# Patient Record
Sex: Female | Born: 1935 | Race: White | Hispanic: No | Smoking: Former smoker
Health system: Southern US, Community
[De-identification: ages and names within clinical notes are randomized; demographics above are authoritative.]

## PROBLEM LIST (undated history)

## (undated) DIAGNOSIS — D473 Essential (hemorrhagic) thrombocythemia: Secondary | ICD-10-CM

## (undated) DIAGNOSIS — E785 Hyperlipidemia, unspecified: Secondary | ICD-10-CM

## (undated) DIAGNOSIS — M8580 Other specified disorders of bone density and structure, unspecified site: Secondary | ICD-10-CM

## (undated) DIAGNOSIS — D75839 Thrombocytosis, unspecified: Secondary | ICD-10-CM

## (undated) DIAGNOSIS — M47812 Spondylosis without myelopathy or radiculopathy, cervical region: Secondary | ICD-10-CM

## (undated) DIAGNOSIS — I10 Essential (primary) hypertension: Secondary | ICD-10-CM

## (undated) DIAGNOSIS — J329 Chronic sinusitis, unspecified: Secondary | ICD-10-CM

## (undated) HISTORY — PX: OTHER SURGICAL HISTORY: SHX169

## (undated) HISTORY — DX: Spondylosis without myelopathy or radiculopathy, cervical region: M47.812

## (undated) HISTORY — DX: Other specified disorders of bone density and structure, unspecified site: M85.80

## (undated) HISTORY — DX: Thrombocytosis, unspecified: D75.839

## (undated) HISTORY — DX: Essential (hemorrhagic) thrombocythemia: D47.3

## (undated) HISTORY — DX: Chronic sinusitis, unspecified: J32.9

## (undated) HISTORY — DX: Hyperlipidemia, unspecified: E78.5

## (undated) HISTORY — DX: Essential (primary) hypertension: I10

---

## 1999-04-09 ENCOUNTER — Other Ambulatory Visit: Admission: RE | Admit: 1999-04-09 | Discharge: 1999-04-09 | Payer: Self-pay | Admitting: *Deleted

## 1999-07-14 ENCOUNTER — Encounter: Admission: RE | Admit: 1999-07-14 | Discharge: 1999-07-14 | Payer: Self-pay | Admitting: *Deleted

## 1999-07-14 ENCOUNTER — Encounter: Payer: Self-pay | Admitting: *Deleted

## 2000-04-14 ENCOUNTER — Other Ambulatory Visit: Admission: RE | Admit: 2000-04-14 | Discharge: 2000-04-14 | Payer: Self-pay | Admitting: *Deleted

## 2001-01-11 ENCOUNTER — Observation Stay (HOSPITAL_COMMUNITY): Admission: RE | Admit: 2001-01-11 | Discharge: 2001-01-12 | Payer: Self-pay | Admitting: General Surgery

## 2003-01-04 ENCOUNTER — Ambulatory Visit (HOSPITAL_COMMUNITY): Admission: RE | Admit: 2003-01-04 | Discharge: 2003-01-04 | Payer: Self-pay | Admitting: Gastroenterology

## 2003-01-30 ENCOUNTER — Encounter: Admission: RE | Admit: 2003-01-30 | Discharge: 2003-01-30 | Payer: Self-pay | Admitting: Internal Medicine

## 2003-01-30 ENCOUNTER — Encounter: Payer: Self-pay | Admitting: Internal Medicine

## 2009-01-13 ENCOUNTER — Encounter: Admission: RE | Admit: 2009-01-13 | Discharge: 2009-01-13 | Payer: Self-pay | Admitting: Internal Medicine

## 2009-01-28 ENCOUNTER — Encounter: Admission: RE | Admit: 2009-01-28 | Discharge: 2009-01-28 | Payer: Self-pay | Admitting: Internal Medicine

## 2009-05-29 ENCOUNTER — Ambulatory Visit (HOSPITAL_COMMUNITY): Admission: RE | Admit: 2009-05-29 | Discharge: 2009-05-29 | Payer: Self-pay | Admitting: Internal Medicine

## 2010-09-18 NOTE — Op Note (Signed)
[facility]  Patient:    Sarah Henson, Sarah Henson Visit Number: [id number] MRN: [id number]          Service Type: SUR Location: [id number] Attending Physician:  Brandy Hale Proc. Date: 01/11/01 Admit Date:  01/11/2001   CC:         Ammie Dalton, M.D.   Operative Report  PREOPERATIVE DIAGNOSIS:  Abdominal wall hernia, suspect spigelian hernia.  POSTOPERATIVE DIAGNOSIS:  Left spigelian hernia and weakness of the left inguinal canal floor.  OPERATION PERFORMED:  Exploration of left lower quadrant of abdomen, repair of left spigelian hernia, and left inguinal hernia with mesh.  SURGEON:  Angelia Mould. Derrell Lolling, M.D.  OPERATIVE INDICATIONS:  This is a 77 year old white female, who presents with a six month history of progressive painful bulge in her left lower quadrant and left groin.  She had one episode of vomiting in the past.  CT scan of the abdomen and pelvis was unremarkable.  Exam reveals a reducible bulge in the left lower quadrant.  This is actually somewhat higher and more lateral in the left groin than the inguinal hernia but appears somewhat lower than a spigelian hernia.  She is symptomatic and clearly has a hernia.  She is brought to the operating room electively for repair of her abdominal wall hernia.  OPERATIVE FINDINGS:  The patient had a small spigelian hernia.  It was just somewhat lower lying than the usual spigelian hernia but clear cut.  The floor of the inguinal canal was weak from the internal ring down toward the pubic tubercle, and although there was not an obvious large hernia in this location, I decided to repair the abdominal wall completely to include not only the spigelian hernia area but the inguinal floor as well.  DESCRIPTION OF PROCEDURE:  Following the induction of general endotracheal anesthesia, the patients abdomen was prepped and draped in a sterile fashion. An oblique incision was made just above the left  groin, in general paralleling the inguinal ligament.  Dissection was carried down through the subcutaneous tissue.  I identified the aponeurosis of the external oblique, cleaned this off, identified the external inguinal ring.  We opened the external oblique in the direction of its fibers and dissected it inferiorly down to the inguinal ligament and superiorly for a fair distance.  The external oblique aponeurosis had to be incised laterally all the way to and slightly beyond the anterosuperior iliac spine.  Once we explored all of this area, we found that she had a spigelian hernia at the lateral border of the rectus sheath.  The defect in the fascia was about 2 cm.  We cleaned this off and reduced all of the herniated fat.  We then closed the fascia with mattress sutures of 0 Vicryl.  We then explored the medial aspect of the inguinal canal all the way down to the pubic tubercle.  The round ligament was isolated, clamped, divided, excised, and the ends were ligated with 2-0 Vicryl and 2-0 silk ties.  One small sensory nerve was tied off with a silk tie.  The floor of the inguinal canal medially was very weak.  I chose to repair the abdominal wall thoroughly from medial to the pubic tubercle to well lateral to the spigelian hernia.  A 3 inch x 6 inch piece of polypropylene mesh was brought to the operative field and cut into an appropriate shape.  The mesh was sutured so as to generously overlap the pubic tubercle medially.  I used a 2-0 Prolene and did a running suture to fix the mesh to the inguinal ligament inferiorly and tied that laterally.  I then secured the mesh medially, superiorly, and laterally with interrupted mattress sutures of 0 Prolene.  I placed about 10 such sutures. This covered the inguinal floor and the spigelian hernia quiet well in all aspects.  The wound was copiously irrigated with saline.  Hemostasis was excellent and achieved with electrocautery.  The external  oblique was closed with a running suture of 2-0 Vicryl.  Scarpas fascia was closed with 3-0 Vicryl and the skin closed with skin staples and Steri-Strips.  Clean bandages were placed and the patient taken to the recovery room in stable condition. Estimated blood loss was about 50 cc.  Complications none.  Sponge, needle and instrument counts were correct. Attending Physician:  Brandy Hale DD:  01/11/01 TD:  01/11/01 Job: [phone]

## 2011-04-14 ENCOUNTER — Telehealth: Payer: Self-pay | Admitting: Oncology

## 2011-04-14 NOTE — Telephone Encounter (Signed)
S/w the pt and she is aware of her new pt appt on 04/29/2011@10 :30am

## 2011-04-15 ENCOUNTER — Telehealth: Payer: Self-pay | Admitting: Oncology

## 2011-04-15 NOTE — Telephone Encounter (Signed)
Referred by Dr. Ralene Ok Dx-Thrombocytosis

## 2011-04-25 ENCOUNTER — Encounter: Payer: Self-pay | Admitting: Oncology

## 2011-04-25 DIAGNOSIS — E785 Hyperlipidemia, unspecified: Secondary | ICD-10-CM | POA: Insufficient documentation

## 2011-04-25 DIAGNOSIS — M8580 Other specified disorders of bone density and structure, unspecified site: Secondary | ICD-10-CM | POA: Insufficient documentation

## 2011-04-25 DIAGNOSIS — I10 Essential (primary) hypertension: Secondary | ICD-10-CM | POA: Insufficient documentation

## 2011-04-25 DIAGNOSIS — D473 Essential (hemorrhagic) thrombocythemia: Secondary | ICD-10-CM | POA: Insufficient documentation

## 2011-04-29 ENCOUNTER — Telehealth: Payer: Self-pay | Admitting: Oncology

## 2011-04-29 ENCOUNTER — Ambulatory Visit: Payer: Medicare Other

## 2011-04-29 ENCOUNTER — Ambulatory Visit (HOSPITAL_BASED_OUTPATIENT_CLINIC_OR_DEPARTMENT_OTHER): Payer: Medicare Other | Admitting: Oncology

## 2011-04-29 ENCOUNTER — Encounter: Payer: Self-pay | Admitting: Oncology

## 2011-04-29 ENCOUNTER — Other Ambulatory Visit (HOSPITAL_BASED_OUTPATIENT_CLINIC_OR_DEPARTMENT_OTHER): Payer: Medicare Other | Admitting: Lab

## 2011-04-29 DIAGNOSIS — E785 Hyperlipidemia, unspecified: Secondary | ICD-10-CM

## 2011-04-29 DIAGNOSIS — I10 Essential (primary) hypertension: Secondary | ICD-10-CM

## 2011-04-29 DIAGNOSIS — M8580 Other specified disorders of bone density and structure, unspecified site: Secondary | ICD-10-CM

## 2011-04-29 DIAGNOSIS — R161 Splenomegaly, not elsewhere classified: Secondary | ICD-10-CM

## 2011-04-29 DIAGNOSIS — D473 Essential (hemorrhagic) thrombocythemia: Secondary | ICD-10-CM

## 2011-04-29 LAB — CBC WITH DIFFERENTIAL/PLATELET
Basophils Absolute: 0.1 10*3/uL (ref 0.0–0.1)
EOS%: 4.3 % (ref 0.0–7.0)
HCT: 43.5 % (ref 34.8–46.6)
HGB: 14.8 g/dL (ref 11.6–15.9)
LYMPH%: 25 % (ref 14.0–49.7)
MCH: 33.2 pg (ref 25.1–34.0)
NEUT%: 61 % (ref 38.4–76.8)
Platelets: 724 10*3/uL — ABNORMAL HIGH (ref 145–400)
lymph#: 2.4 10*3/uL (ref 0.9–3.3)

## 2011-04-29 LAB — MORPHOLOGY: PLT EST: INCREASED

## 2011-04-29 LAB — LACTATE DEHYDROGENASE: LDH: 207 U/L (ref 94–250)

## 2011-04-29 NOTE — Progress Notes (Signed)
[facility] INITIAL HEMATOLOGY CONSULTATION  Referral MD:  Dr. Ralene Ok, M.D.   Reason for Referral: Thrombocytosis.     HPI: Sarah Henson is a 77 yo Caucasian woman with HTN, HLP.  The oldest CBC provided for review dated from 01/28/2009 when WBC 7.4; hgb 13.2; Plt 470.  Ever since, it has been increasing.  The last CBC on 04/06/2011 showed WBC 8.4; Hgb 13.8; plt 690.  She was thus kindly referred for evaluation.  She is here for the first time with her son.  She reports mild fatigue.   She has mild SOB and DOE on walking for more than one block.  However, she is able to ambulate at home and grocery store without problem.  She denies fever, confusion, headache, visual changes, nausea/vomiting, chest pain, abdominal pain, early satiety, pruritus with warm shower, neuropathy, leg/calves swelling or pain.    Patient denies  confusion, drenching night sweats, palpable lymph node swelling, mucositis, odynophagia, dysphagia, nausea vomiting, jaundice, chest pain, palpitation, shortness of breath, dyspnea on exertion, productive cough, gum bleeding, epistaxis, hematemesis, hemoptysis, abdominal pain, abdominal swelling, early satiety, melena, hematochezia, hematuria, skin rash, spontaneous bleeding, joint swelling, joint pain, heat or cold intolerance, bowel bladder incontinence, back pain, focal motor weakness, paresthesia, depression, suicidal or homocidal ideation, feeling hopelessness.     Past Medical History  Diagnosis Date  . Hypertension   . Hyperlipidemia   . Osteopenia   . Herpes zoster   . Sinusitis   . Cervical spondylosis   . Thrombocytosis   :    Past Surgical History  Procedure Date  . Left inguinal hernia repair   . Bilateral catarac surgery   :   CURRENT MEDS: Current Outpatient Prescriptions  Medication Sig Dispense Refill  . amLODipine-olmesartan (AZOR) 5-40 MG per tablet Take 1 tablet by mouth daily.        Marland Kitchen aspirin 81 MG tablet Take 81 mg by  mouth daily.        . calcium-vitamin D (OSCAL WITH D) 500-200 MG-UNIT per tablet Take 1 tablet by mouth daily.        . hydrochlorothiazide (MICROZIDE) 12.5 MG capsule Take 12.5 mg by mouth daily.        . Multiple Vitamins-Minerals (CENTRUM SILVER PO) Take 1 tablet by mouth daily.        . nebivolol (BYSTOLIC) 5 MG tablet Take 5 mg by mouth daily.        . simvastatin (ZOCOR) 40 MG tablet Take 40 mg by mouth at bedtime.            No Known Allergies:  Family History  Problem Relation Age of Onset  . Cancer Sister     Lung cancer  . Stroke Sister   :  History   Social History  . Marital Status: Divorced    Spouse Name: N/A    Number of Children: 2  . Years of Education: N/A   Occupational History  . retired Film/video editor    Social History Main Topics  . Smoking status: Former Smoker -- 0.5 packs/day for 15 years    Quit date: 05/03/1988  . Smokeless tobacco: Never Used  . Alcohol Use: No  . Drug Use: No  . Sexually Active: No   Other Topics Concern  . Not on file   Social History Narrative  . No narrative on file   REVIEW OF SYSTEM:  The rest of the 14-point review of sytem was negative.   Exam:  ECOG 0-1  General:  Mildly obese woman in no acute distress.  Eyes:  no scleral icterus.  ENT:  There were no oropharyngeal lesions.  Neck was without thyromegaly.  Lymphatics:  Negative cervical, supraclavicular or axillary adenopathy.  Respiratory: lungs were clear bilaterally without wheezing or crackles.  Cardiovascular:  Regular rate and rhythm, S1/S2, without murmur, rub or gallop.  There was no pedal edema.  GI:  abdomen was soft, flat, nontender, nondistended.  The splenic edge was about 5 cm below costal margin with deep inspiration.   Muscoloskeletal:  no spinal tenderness of palpation of vertebral spine.  Skin exam was without echymosis, petichae.  Neuro exam was nonfocal.  Patient was able to get on and off exam table without assistance.  Gait was normal.  Patient  was alerted and oriented.  Attention was good.   Language was appropriate.  Mood was normal without depression.  Speech was not pressured.  Thought content was not tangential.    LABS:  Lab Results  Component Value Date   WBC 9.5 04/29/2011   HGB 14.8 04/29/2011   HCT 43.5 04/29/2011   PLT 724* 04/29/2011    No results found.  Blood smear review:   I personally reviewed the patient's peripheral blood smear today.  There was isocytosis.  There was no peripheral blast.  However, there was slight increase in left shift with meta's.   There was no schistocytosis, spherocytosis, target cell, rouleaux formation, tear drop cell.  There was no giant platelets or platelet clumps.    ASSESSMENT AND PLAN:   1.  Hypertension:  She is on nebivolol, amlodipine/olmesartan, and HCTZ per PCP. 2.  Hyperlipidemia:  She is on Zocor per PCP. 3.  Progressive worsened thrombocytosis with slight hypersplenomegaly on exam:  Ddx:  Myeloproliferative disease such as CML vs. ET. Vs. Myelofibrosis vs. Reactive to a lymphoproliferative process.  There is no evidence of chronic infection or inflammation or iron deficiency anemia.  Work up:  Abdominal US to rule out splenomegaly.  I sent for JAK-2 mutation and BCR-ABL to rule out ET and CML respectively.  I strongly recommended diagnostic bone marrow biopsy to further characterize her disease process.  I explained in detail the pros and cons of diagnostic bone marrow biopsy to patient and her son.  They expressed informed understanding and wished to proceed.  She is scheduled for bone marrow biopsy on 05/07/2011.    Follow up:  I'll see her in late January 2013 in order to have result of cytogenetics and will go over treatment option for the appropriate diagnosis at that time.  If there is concerning diagnosis from morphology of BM bx, I'll see her sooner.    The length of time of the face-to-face encounter was 45  minutes. More than 50% of time was spent counseling and  coordination of care.

## 2011-04-29 NOTE — Telephone Encounter (Signed)
called pt with u/s appt,aware to reg in radiology after bx and to be npo 6hrs   aom

## 2011-04-29 NOTE — Telephone Encounter (Signed)
appts made for 05/2011 for lab and rv and bone mar. rx.printed for pt   aom

## 2011-05-05 LAB — BCR/ABL (LIO MMD)

## 2011-05-07 ENCOUNTER — Ambulatory Visit: Payer: Medicare Other | Admitting: *Deleted

## 2011-05-07 ENCOUNTER — Other Ambulatory Visit: Payer: Self-pay | Admitting: Oncology

## 2011-05-07 ENCOUNTER — Ambulatory Visit (HOSPITAL_BASED_OUTPATIENT_CLINIC_OR_DEPARTMENT_OTHER): Payer: Medicare Other | Admitting: Oncology

## 2011-05-07 ENCOUNTER — Ambulatory Visit (HOSPITAL_COMMUNITY)
Admission: RE | Admit: 2011-05-07 | Discharge: 2011-05-07 | Disposition: A | Discharge status: Home / Self Care | Payer: Medicare Other | Source: Ambulatory Visit | Attending: Oncology | Admitting: Oncology

## 2011-05-07 ENCOUNTER — Other Ambulatory Visit: Payer: Medicare Other

## 2011-05-07 ENCOUNTER — Other Ambulatory Visit (HOSPITAL_COMMUNITY)
Admission: RE | Admit: 2011-05-07 | Discharge: 2011-05-07 | Disposition: A | Discharge status: Home / Self Care | Payer: Medicare Other | Source: Ambulatory Visit | Attending: Oncology | Admitting: Oncology

## 2011-05-07 DIAGNOSIS — E785 Hyperlipidemia, unspecified: Secondary | ICD-10-CM

## 2011-05-07 DIAGNOSIS — D473 Essential (hemorrhagic) thrombocythemia: Secondary | ICD-10-CM

## 2011-05-07 DIAGNOSIS — M8580 Other specified disorders of bone density and structure, unspecified site: Secondary | ICD-10-CM

## 2011-05-07 DIAGNOSIS — R161 Splenomegaly, not elsewhere classified: Secondary | ICD-10-CM

## 2011-05-07 DIAGNOSIS — I10 Essential (primary) hypertension: Secondary | ICD-10-CM

## 2011-05-07 NOTE — Patient Instructions (Signed)
Leave dressing on until tomorrow morning.  Vital signsstable and patient states understanding

## 2011-05-07 NOTE — Progress Notes (Signed)
Bone marrow biopsy and aspiration.  INDICATION:  Procedure: After obtained from consent, Sarah Henson was placed in the prone position. Time out was performed verifying correct patient and procedure. The skin overlying the left posterior crest was prepped with Betadine and draped in the usual sterile fashion. The skin and periosteum were infiltrated with 25 mL of 2% lidocaine as she was still uncomfortable with the first 10mL. A small puncture wound was made with #11 scalpel blade.  Bone marrow aspirate was obtained on the first pass of the aspiration needle.  Two separate core biopsies were obtained through the same incision as the first one was subcm.   The aspirate was sent for routine histology, flow cytometry, and cytogenetics.  Core biopsy was sent for routine histology.   Ryhanna Dunsmore Leeds tolerated procedure well with minimal  blood loss and without immediate complication.   A sterile dressing was applied.   Jethro Bolus M.D. 05/07/2011

## 2011-05-12 ENCOUNTER — Encounter: Payer: Self-pay | Admitting: Oncology

## 2011-05-28 ENCOUNTER — Ambulatory Visit (HOSPITAL_BASED_OUTPATIENT_CLINIC_OR_DEPARTMENT_OTHER): Payer: Medicare Other | Admitting: Oncology

## 2011-05-28 ENCOUNTER — Telehealth: Payer: Self-pay | Admitting: Oncology

## 2011-05-28 ENCOUNTER — Other Ambulatory Visit (HOSPITAL_BASED_OUTPATIENT_CLINIC_OR_DEPARTMENT_OTHER): Payer: Medicare Other

## 2011-05-28 VITALS — BP 141/80 | HR 68 | Temp 97.2°F | Ht 60.0 in | Wt 164.8 lb

## 2011-05-28 DIAGNOSIS — D473 Essential (hemorrhagic) thrombocythemia: Secondary | ICD-10-CM

## 2011-05-28 DIAGNOSIS — E785 Hyperlipidemia, unspecified: Secondary | ICD-10-CM

## 2011-05-28 DIAGNOSIS — I10 Essential (primary) hypertension: Secondary | ICD-10-CM

## 2011-05-28 LAB — CBC WITH DIFFERENTIAL/PLATELET
Basophils Absolute: 0.1 10*3/uL (ref 0.0–0.1)
Eosinophils Absolute: 0.5 10*3/uL (ref 0.0–0.5)
HGB: 14.3 g/dL (ref 11.6–15.9)
MCV: 95.9 fL (ref 79.5–101.0)
MONO#: 1.2 10*3/uL — ABNORMAL HIGH (ref 0.1–0.9)
MONO%: 11.6 % (ref 0.0–14.0)
NEUT#: 5.3 10*3/uL (ref 1.5–6.5)
RDW: 14 % (ref 11.2–14.5)

## 2011-05-28 MED ORDER — HYDROXYUREA 500 MG PO CAPS: 500.0000 mg | ORAL_CAPSULE | Freq: Two times a day (BID) | ORAL | Status: AC

## 2011-05-28 NOTE — Telephone Encounter (Signed)
appts made for 3weeks,7weeks and 42mo,appts printed for pt    aom

## 2011-05-28 NOTE — Progress Notes (Signed)
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   CURRENT THERAPY:  Here to discuss treatment option today.   INTERVAL HISTORY: Sarah Henson 78 y.o. female returns to clinic with her son and daughter to discuss result of work up.  She reports feeling well.  She denies Sarah Henson, visual changes, SOB, CP, palpitation, bleeding symptoms, leg/calf pain/swelling, skin rash.    MEDICAL HISTORY: Past Medical History  Diagnosis Date  . Hypertension   . Hyperlipidemia   . Osteopenia   . Herpes zoster   . Sinusitis   . Cervical spondylosis   . Thrombocytosis     SURGICAL HISTORY:  Past Surgical History  Procedure Date  . Left inguinal hernia repair   . Bilateral catarac surgery     MEDICATIONS: Current Outpatient Prescriptions  Medication Sig Dispense Refill  . amLODipine-olmesartan (AZOR) 5-40 MG per tablet Take 1 tablet by mouth daily.        Marland Kitchen aspirin 81 MG tablet Take 81 mg by mouth daily.        . calcium-vitamin D (OSCAL WITH D) 500-200 MG-UNIT per tablet Take 1 tablet by mouth daily.        . hydrochlorothiazide (MICROZIDE) 12.5 MG capsule Take 12.5 mg by mouth daily.        . Multiple Vitamins-Minerals (CENTRUM SILVER Henson) Take 1 tablet by mouth daily.        . nebivolol (BYSTOLIC) 5 MG tablet Take 5 mg by mouth daily.        . simvastatin (ZOCOR) 40 MG tablet Take 40 mg by mouth at bedtime.        . hydroxyurea (HYDREA) 500 MG capsule Take 1 capsule (500 mg total) by mouth 2 (two) times daily. May take with food to minimize GI side effects.  60 capsule  3    ALLERGIES:   has no known allergies.  REVIEW OF SYSTEMS:  The rest of the 14-point review of system was negative.   Filed Vitals:   05/28/11 0933  BP: 141/80  Pulse: 68  Temp: 97.2 F (36.2 C)   Wt Readings from Last 3 Encounters:  05/28/11 164 lb 12.8 oz (74.753 kg)  04/29/11 165 lb 8 oz (75.07 kg)   ECOG  Performance status: 0-1  PHYSICAL EXAMINATION:   General:  well-nourished in no acute distress.  Eyes:  no scleral icterus.  ENT:  There were no oropharyngeal lesions.  Neck was without thyromegaly.  Lymphatics:  Negative cervical, supraclavicular or axillary adenopathy.  Respiratory: lungs were clear bilaterally without wheezing or crackles.  Cardiovascular:  Regular rate and rhythm, S1/S2, without murmur, rub or gallop.  There was no pedal edema.  GI:  abdomen was soft, flat, nontender, nondistended, without organomegaly.  Muscoloskeletal:  no spinal tenderness of palpation of vertebral spine.  Skin exam was without echymosis, petichae.  Neuro exam was nonfocal.  Patient was able to get on and off exam table without assistance.  Gait was normal.  Patient was alerted and oriented.  Attention was good.   Language was appropriate.  Mood was normal without depression.  Speech was not pressured.  Thought content was not tangential.     LABORATORY/RADIOLOGY DATA:  Lab Results  Component Value Date   WBC 10.5* 05/28/2011   HGB 14.3 05/28/2011   HCT 42.4 05/28/2011   PLT 711* 05/28/2011    US Abdomen Limited  05/07/2011  *RADIOLOGY REPORT*  Clinical Data:  Thrombocytosis.  LIMITED ABDOMINAL ULTRASOUND  Comparison:  CT scan dated 08/27/2005  Findings: The spleen is not enlarged, measuring 5.6 cm in length with a volume of 69.3 cc.  IMPRESSION: Normal spleen.  No splenomegaly.                   Original Report Authenticated By: Gwynn Burly, M.D.   ASSESSMENT AND PLAN:   1. Hypertension: She is on nebivolol, amlodipine/olmesartan, and HCTZ per PCP.  2. Hyperlipidemia: She is on Zocor per PCP.  3. JAK-2 mutation Essential Thrombocytosis: - Diagnosis:  I discussed with patient and her family members that even though her JAK-2 mutation was negative; however, this test is only 50% sensitive for ET.  Due to progressive nature of her thrombocytosis, I still have very high clinical suspicion for ET.  BM bx  showed hypercellular but no dysplasia; cytogenetics were negative.  BCR/ABL was negative ruling out CML.  She has no obvious reason for reactive thrombocytosis.   - Prognosis:  ET in patients older than 65 carries risk of thrombotic complication.  It also has rare risk of AML and myelofibrosis transformation.   - Treatment:  I recommended Hydrea 500mg  Henson BID along with ASA 81mg  Henson daily to decrease the risk of thrombotic complication.  Hydrea does not alter the risk of AML or myelofibrosis transformation.  I discussed with patient and family members side effects of Hydrea which include but not limited to cytopenia, fatigue, bleeding, infection, skin rash, nausea/vomiting, diarrhea.  [name] expressed informed understanding and wished to proceed with Hydrea.   4.  Follow up:  Lab in 3, 7 weeks.  I'll see her in about 3 months.   The length of time of the face-to-face encounter was 25 minutes. More than 50% of time was spent counseling and coordination of care.

## 2011-06-03 ENCOUNTER — Encounter: Payer: Self-pay | Admitting: Oncology

## 2011-06-18 ENCOUNTER — Ambulatory Visit: Payer: Medicare Other | Admitting: Lab

## 2011-06-18 DIAGNOSIS — D473 Essential (hemorrhagic) thrombocythemia: Secondary | ICD-10-CM

## 2011-06-18 LAB — CBC WITH DIFFERENTIAL/PLATELET
Basophils Absolute: 0 10*3/uL (ref 0.0–0.1)
Eosinophils Absolute: 0.2 10*3/uL (ref 0.0–0.5)
HCT: 41.4 % (ref 34.8–46.6)
HGB: 13.9 g/dL (ref 11.6–15.9)
LYMPH%: 34.5 % (ref 14.0–49.7)
MCV: 98.9 fL (ref 79.5–101.0)
MONO%: 9.7 % (ref 0.0–14.0)
NEUT#: 2.9 10*3/uL (ref 1.5–6.5)
NEUT%: 51.5 % (ref 38.4–76.8)
Platelets: 431 10*3/uL — ABNORMAL HIGH (ref 145–400)
RBC: 4.19 10*6/uL (ref 3.70–5.45)

## 2011-06-21 NOTE — Progress Notes (Signed)
Called patient to inform her that her platelet count is improving and that she should continue her current dose of Hydrea (500mg  BID), per Dr. Gaylyn Rong; patient pleased at counts and verbalized understanding.

## 2011-07-10 IMAGING — CR DG CHEST 2V
2 series · 2 of 2 positions shown · non-contrast
Comparison: Chest x-ray of 01/13/2009

CLINICAL DATA: Pneumonia, follow-up

CHEST - 2 VIEW

[view not recorded (1 of 2)]
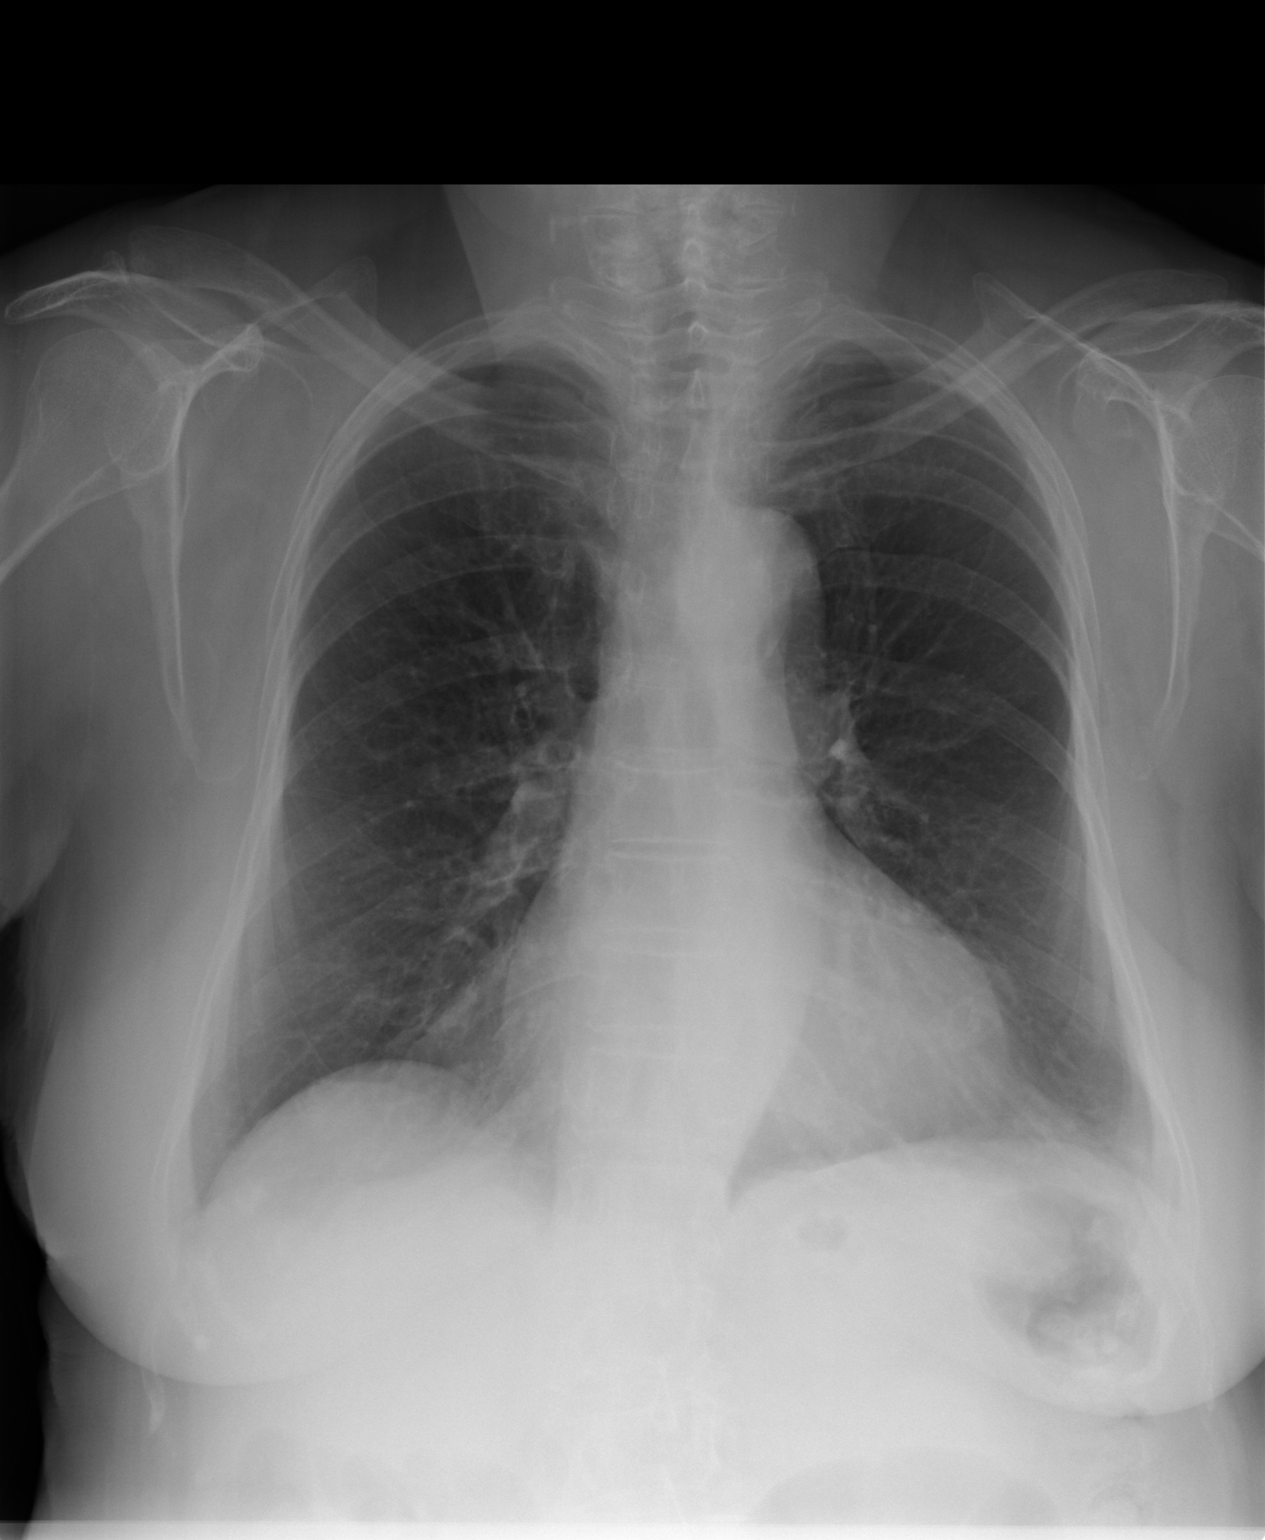

[view not recorded (2 of 2)]
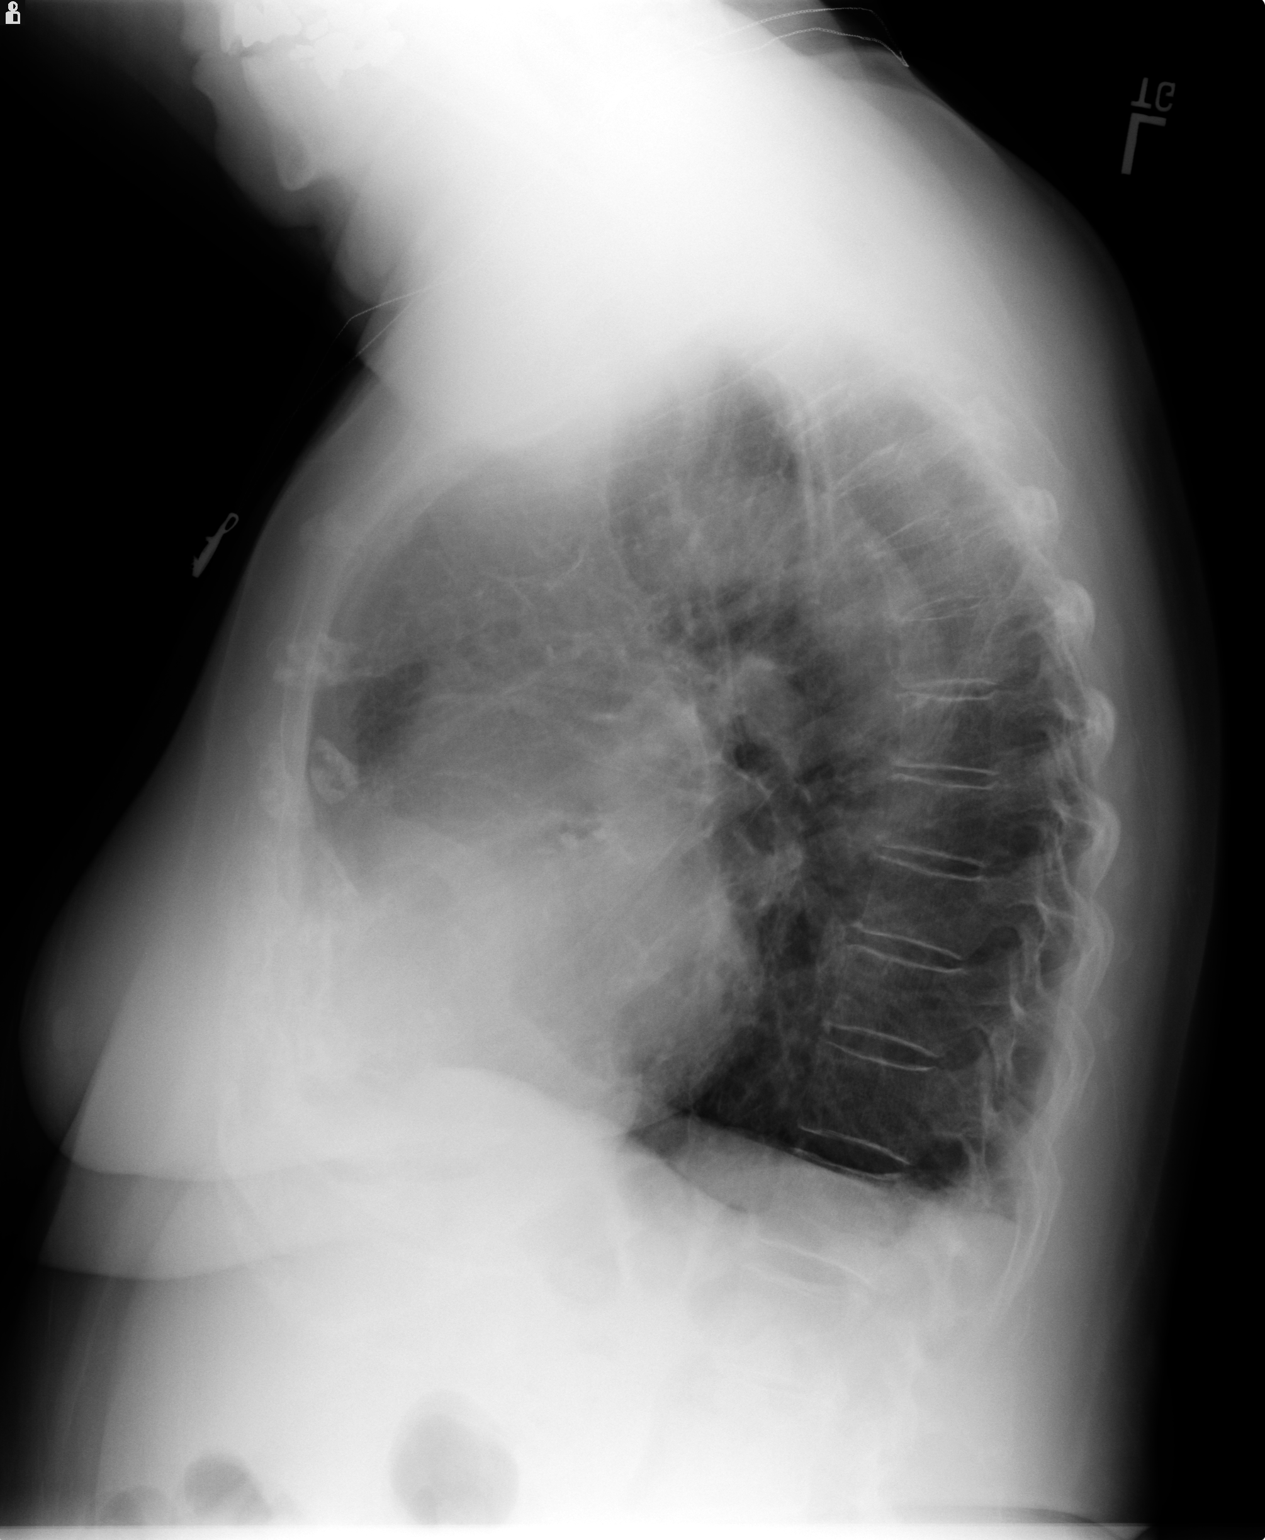

[2 of 2 positions shown; findings below may reference images not displayed]

FINDINGS: No active infiltrate or effusion is seen.  The lungs are
hyperaerated consistent with COPD.  Cardiomegaly is stable.  No
acute bony abnormality is noted.
IMPRESSION: No active infiltrate or effusion.  COPD.  Stable borderline
cardiomegaly.

## 2011-07-12 ENCOUNTER — Other Ambulatory Visit: Payer: Self-pay | Admitting: Internal Medicine

## 2011-07-12 DIAGNOSIS — I10 Essential (primary) hypertension: Secondary | ICD-10-CM

## 2011-07-12 DIAGNOSIS — N289 Disorder of kidney and ureter, unspecified: Secondary | ICD-10-CM

## 2011-07-15 ENCOUNTER — Ambulatory Visit
Admission: RE | Admit: 2011-07-15 | Discharge: 2011-07-15 | Disposition: A | Discharge status: Home / Self Care | Payer: Medicare Other | Source: Ambulatory Visit | Attending: Internal Medicine | Admitting: Internal Medicine

## 2011-07-15 DIAGNOSIS — I10 Essential (primary) hypertension: Secondary | ICD-10-CM

## 2011-07-15 DIAGNOSIS — N289 Disorder of kidney and ureter, unspecified: Secondary | ICD-10-CM

## 2011-07-16 ENCOUNTER — Telehealth: Payer: Self-pay | Admitting: *Deleted

## 2011-07-16 ENCOUNTER — Other Ambulatory Visit (HOSPITAL_BASED_OUTPATIENT_CLINIC_OR_DEPARTMENT_OTHER): Payer: Medicare Other | Admitting: Lab

## 2011-07-16 ENCOUNTER — Encounter: Payer: Self-pay | Admitting: *Deleted

## 2011-07-16 DIAGNOSIS — D473 Essential (hemorrhagic) thrombocythemia: Secondary | ICD-10-CM

## 2011-07-16 LAB — CBC WITH DIFFERENTIAL/PLATELET
BASO%: 0.6 % (ref 0.0–2.0)
HCT: 39.5 % (ref 34.8–46.6)
LYMPH%: 27.5 % (ref 14.0–49.7)
MCH: 34.9 pg — ABNORMAL HIGH (ref 25.1–34.0)
MCHC: 33.9 g/dL (ref 31.5–36.0)
MCV: 103 fL — ABNORMAL HIGH (ref 79.5–101.0)
MONO%: 8.5 % (ref 0.0–14.0)
NEUT%: 61.8 % (ref 38.4–76.8)
Platelets: 318 10*3/uL (ref 145–400)
RBC: 3.83 10*6/uL (ref 3.70–5.45)

## 2011-07-16 NOTE — Telephone Encounter (Signed)
Called pt w/ results of Platelets and instructed to continue same dose of Hydrea per Dr. Gaylyn Rong.  She is currently taking 500 mg BID.  Pt to keep next appt as scheduled in April.  Pt verbalized understanding.

## 2011-07-16 NOTE — Telephone Encounter (Signed)
Message copied by Wende Mott on Fri Jul 16, 2011  2:49 PM ------      Message from: Jethro Bolus T      Created: Fri Jul 16, 2011  2:06 PM       Please call pt.  Her Plt is much better.  Please continue Hydrea the same dose for her ET.  Thanks.

## 2011-08-27 ENCOUNTER — Encounter: Payer: Self-pay | Admitting: Oncology

## 2011-08-27 ENCOUNTER — Other Ambulatory Visit (HOSPITAL_BASED_OUTPATIENT_CLINIC_OR_DEPARTMENT_OTHER): Payer: Medicare Other | Admitting: Lab

## 2011-08-27 ENCOUNTER — Telehealth: Payer: Self-pay | Admitting: Oncology

## 2011-08-27 ENCOUNTER — Other Ambulatory Visit: Payer: Self-pay | Admitting: Oncology

## 2011-08-27 ENCOUNTER — Ambulatory Visit (HOSPITAL_BASED_OUTPATIENT_CLINIC_OR_DEPARTMENT_OTHER): Payer: Medicare Other | Admitting: Oncology

## 2011-08-27 VITALS — BP 141/78 | HR 69 | Temp 97.8°F | Ht 60.0 in | Wt 167.2 lb

## 2011-08-27 DIAGNOSIS — D473 Essential (hemorrhagic) thrombocythemia: Secondary | ICD-10-CM

## 2011-08-27 DIAGNOSIS — R5381 Other malaise: Secondary | ICD-10-CM

## 2011-08-27 DIAGNOSIS — R5383 Other fatigue: Secondary | ICD-10-CM

## 2011-08-27 HISTORY — DX: Essential (hemorrhagic) thrombocythemia: D47.3

## 2011-08-27 LAB — CBC WITH DIFFERENTIAL/PLATELET
BASO%: 0.4 % (ref 0.0–2.0)
EOS%: 2 % (ref 0.0–7.0)
LYMPH%: 41.1 % (ref 14.0–49.7)
MCH: 37.5 pg — ABNORMAL HIGH (ref 25.1–34.0)
MCHC: 34.6 g/dL (ref 31.5–36.0)
MONO#: 0.5 10*3/uL (ref 0.1–0.9)
NEUT%: 46.9 % (ref 38.4–76.8)
Platelets: 231 10*3/uL (ref 145–400)
RBC: 3.41 10*6/uL — ABNORMAL LOW (ref 3.70–5.45)
WBC: 5.1 10*3/uL (ref 3.9–10.3)
lymph#: 2.1 10*3/uL (ref 0.9–3.3)
nRBC: 0 % (ref 0–0)

## 2011-08-27 LAB — COMPREHENSIVE METABOLIC PANEL
ALT: 18 U/L (ref 0–35)
AST: 28 U/L (ref 0–37)
Alkaline Phosphatase: 48 U/L (ref 39–117)
BUN: 19 mg/dL (ref 6–23)
Calcium: 9.8 mg/dL (ref 8.4–10.5)
Chloride: 100 mEq/L (ref 96–112)
Creatinine, Ser: 1 mg/dL (ref 0.50–1.10)

## 2011-08-27 MED ORDER — HYDROXYUREA 500 MG PO CAPS: 500.0000 mg | ORAL_CAPSULE | Freq: Two times a day (BID) | ORAL | Status: DC

## 2011-08-27 NOTE — Telephone Encounter (Signed)
Gv pt appt for [id number]-aug2013 

## 2011-08-27 NOTE — Patient Instructions (Signed)
1.  Your diagnosis:  ET (Essential Thrombocytosis:  Elevated platetlet count). - Risk of no treatment:  Blood clots. - Treatment:  Hydrea and baby aspirin 81mg  once a day.   2.  Follow up:   - Lab-only appointment in about 2 months. - Return visit with my nurse practitioner Belenda Cruise in about 4-5 months.

## 2011-08-27 NOTE — Progress Notes (Signed)
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   CURRENT THERAPY: started on Hydrea 500mg  PO BID since Jan 2013.   INTERVAL HISTORY: Sarah Henson 78 y.o. female returns for regular follow up with her son.  She reported that ever since she started on Hydrea, she has been having fatigue.  She says that she does not feel as invigorated as before.  However, she is still able to live by herself.  She is still independent of activities of daily living including shopping, cooking, and cleaning.  She had a rash last month in the back and was given a course of antibiotic by her PCP with complete resolution of the rash.    Patient denies headache, visual changes, confusion, drenching night sweats, palpable lymph node swelling, mucositis, odynophagia, dysphagia, nausea vomiting, jaundice, chest pain, palpitation, shortness of breath, dyspnea on exertion, productive cough, gum bleeding, epistaxis, hematemesis, hemoptysis, abdominal pain, abdominal swelling, early satiety, melena, hematochezia, hematuria, skin rash, spontaneous bleeding, joint swelling, joint pain, heat or cold intolerance, bowel bladder incontinence, back pain, focal motor weakness, paresthesia, depression, suicidal or homocidal ideation, feeling hopelessness.   Past Medical History  Diagnosis Date  . Hypertension   . Hyperlipidemia   . Osteopenia   . Herpes zoster   . Sinusitis   . Cervical spondylosis   . Thrombocytosis   . Essential thrombocytosis 08/27/2011    Past Surgical History  Procedure Date  . Left inguinal hernia repair   . Bilateral catarac surgery     Current Outpatient Prescriptions  Medication Sig Dispense Refill  . amLODipine-olmesartan (AZOR) 5-40 MG per tablet Take 1 tablet by mouth daily.        Marland Kitchen aspirin 81 MG tablet Take 81 mg by mouth daily.          . calcium-vitamin D (OSCAL WITH D) 500-200 MG-UNIT per tablet Take 1 tablet by mouth daily.        . hydrochlorothiazide (MICROZIDE) 12.5 MG capsule Take 12.5 mg by mouth daily.        . hydroxyurea (HYDREA) 500 MG capsule Take 500 mg by mouth 2 (two) times daily. May take with food to minimize GI side effects.      . Multiple Vitamins-Minerals (CENTRUM SILVER PO) Take 1 tablet by mouth daily.        . nebivolol (BYSTOLIC) 5 MG tablet Take 5 mg by mouth daily.        . simvastatin (ZOCOR) 40 MG tablet Take 40 mg by mouth at bedtime.          ALLERGIES:   has no known allergies.  REVIEW OF SYSTEMS:  The rest of the 14-point review of system was negative.   Filed Vitals:   08/27/11 1339  BP: 141/78  Pulse: 69  Temp: 97.8 F (36.6 C)   Wt Readings from Last 3 Encounters:  08/27/11 167 lb 3.2 oz (75.841 kg)  05/28/11 164 lb 12.8 oz (74.753 kg)  04/29/11 165 lb 8 oz (75.07 kg)   ECOG Performance status: 1  PHYSICAL EXAMINATION:  General:  well-nourished in no acute distress.  Eyes:  no scleral icterus.  ENT:  There were no oropharyngeal lesions.  Neck was without thyromegaly.  Lymphatics:  Negative cervical, supraclavicular or axillary adenopathy.  Respiratory: lungs were clear bilaterally without wheezing or crackles.  Cardiovascular:  Regular rate and rhythm, S1/S2,  without murmur, rub or gallop.  There was no pedal edema.  GI:  abdomen was soft, flat, nontender, nondistended, without organomegaly.  Muscoloskeletal:  no spinal tenderness of palpation of vertebral spine.  Skin exam was without echymosis, petichae.  Neuro exam was nonfocal.  Patient was able to get on and off exam table without assistance.  Gait was normal.  Patient was alerted and oriented.  Attention was good.   Language was appropriate.  Mood was normal without depression.  Speech was not pressured.  Thought content was not tangential.     LABORATORY/RADIOLOGY DATA:  Lab Results  Component Value Date   WBC 5.1  08/27/2011   HGB 12.8 08/27/2011   HCT 37.0 08/27/2011   PLT 231 08/27/2011    ASSESSMENT AND PLAN:   1. Hypertension: She is on nebivolol, amlodipine/olmesartan, and HCTZ with good control per PCP.   2. Hyperlipidemia: She is on Zocor per PCP.   3. JAK-2 mutation Essential Thrombocytosis:  -She is having good response with Hydrea.  Her plt has normalized without leukocytopenia or anemia.  She has not had thrombotic complication from ET.  She queries about stopping Hydrea.  I expressed my concern that patients with ET who are older than 65, if left untreated has high risk of thrombotic complication.  I thus recommended her to continue Hydrea at current dose 500mg  PO BID along with aspirin 81mg  PO daily.   4. Follow up: Lab here at the [facility] in about 2 months.  She has return visit in about 4-5 months.    The length of time of the face-to-face encounter was . More than 50% of time was spent counseling and coordination of care.

## 2011-10-27 ENCOUNTER — Other Ambulatory Visit (HOSPITAL_BASED_OUTPATIENT_CLINIC_OR_DEPARTMENT_OTHER): Payer: Medicare Other | Admitting: Lab

## 2011-10-27 DIAGNOSIS — D473 Essential (hemorrhagic) thrombocythemia: Secondary | ICD-10-CM

## 2011-10-27 LAB — CBC WITH DIFFERENTIAL/PLATELET
BASO%: 0.4 % (ref 0.0–2.0)
Basophils Absolute: 0 10*3/uL (ref 0.0–0.1)
EOS%: 1.4 % (ref 0.0–7.0)
HCT: 36.6 % (ref 34.8–46.6)
HGB: 12.3 g/dL (ref 11.6–15.9)
LYMPH%: 37.8 % (ref 14.0–49.7)
MCH: 41.5 pg — ABNORMAL HIGH (ref 25.1–34.0)
MCHC: 33.8 g/dL (ref 31.5–36.0)
MCV: 122.8 fL — ABNORMAL HIGH (ref 79.5–101.0)
MONO%: 9 % (ref 0.0–14.0)
NEUT%: 51.4 % (ref 38.4–76.8)
Platelets: 270 10*3/uL (ref 145–400)
lymph#: 2.1 10*3/uL (ref 0.9–3.3)

## 2011-10-27 LAB — COMPREHENSIVE METABOLIC PANEL
ALT: 19 U/L (ref 0–35)
AST: 28 U/L (ref 0–37)
Alkaline Phosphatase: 48 U/L (ref 39–117)
BUN: 21 mg/dL (ref 6–23)
Calcium: 9.7 mg/dL (ref 8.4–10.5)
Chloride: 102 mEq/L (ref 96–112)
Creatinine, Ser: 1.05 mg/dL (ref 0.50–1.10)
Total Bilirubin: 1.1 mg/dL (ref 0.3–1.2)

## 2011-10-28 ENCOUNTER — Telehealth: Payer: Self-pay | Admitting: Oncology

## 2011-10-28 NOTE — Telephone Encounter (Addendum)
Spoke with patient and notified her of labs. No change to Hydrea dose.  Message copied by Myrtis Ser on Thu Oct 28, 2011 10:32 AM ------      Message from: HA, Raliegh Ip T      Created: Thu Oct 28, 2011  8:07 AM       Please call pt or her son.  Her plt is within normal range.  Please continue Hydrea at current dose for her ET.  Thanks.

## 2011-12-27 ENCOUNTER — Ambulatory Visit (HOSPITAL_BASED_OUTPATIENT_CLINIC_OR_DEPARTMENT_OTHER): Payer: Medicare Other | Admitting: Oncology

## 2011-12-27 ENCOUNTER — Other Ambulatory Visit (HOSPITAL_BASED_OUTPATIENT_CLINIC_OR_DEPARTMENT_OTHER): Payer: Medicare Other | Admitting: Lab

## 2011-12-27 ENCOUNTER — Telehealth: Payer: Self-pay | Admitting: Oncology

## 2011-12-27 ENCOUNTER — Encounter: Payer: Self-pay | Admitting: Oncology

## 2011-12-27 VITALS — BP 128/77 | HR 72 | Temp 97.1°F | Resp 18 | Ht 60.0 in | Wt 163.1 lb

## 2011-12-27 DIAGNOSIS — I10 Essential (primary) hypertension: Secondary | ICD-10-CM

## 2011-12-27 DIAGNOSIS — R5383 Other fatigue: Secondary | ICD-10-CM

## 2011-12-27 DIAGNOSIS — D473 Essential (hemorrhagic) thrombocythemia: Secondary | ICD-10-CM

## 2011-12-27 DIAGNOSIS — E785 Hyperlipidemia, unspecified: Secondary | ICD-10-CM

## 2011-12-27 LAB — CBC WITH DIFFERENTIAL/PLATELET
Basophils Absolute: 0 10*3/uL (ref 0.0–0.1)
EOS%: 0.9 % (ref 0.0–7.0)
HCT: 34.3 % — ABNORMAL LOW (ref 34.8–46.6)
HGB: 12 g/dL (ref 11.6–15.9)
LYMPH%: 39.4 % (ref 14.0–49.7)
MCH: 42.8 pg — ABNORMAL HIGH (ref 25.1–34.0)
MCHC: 34.9 g/dL (ref 31.5–36.0)
MONO#: 0.4 10*3/uL (ref 0.1–0.9)
NEUT%: 50.6 % (ref 38.4–76.8)
Platelets: 236 10*3/uL (ref 145–400)
lymph#: 2 10*3/uL (ref 0.9–3.3)

## 2011-12-27 LAB — COMPREHENSIVE METABOLIC PANEL (CC13)
BUN: 23 mg/dL (ref 7.0–26.0)
CO2: 28 mEq/L (ref 22–29)
Calcium: 9.8 mg/dL (ref 8.4–10.4)
Chloride: 102 mEq/L (ref 98–107)
Creatinine: 1.1 mg/dL (ref 0.6–1.1)
Total Bilirubin: 0.8 mg/dL (ref 0.20–1.20)

## 2011-12-27 NOTE — Telephone Encounter (Signed)
gv pt appt schedule for October and December,

## 2011-12-27 NOTE — Progress Notes (Signed)
[facility]  Telephone:(336) [phone] Fax:(336) [phone]   OFFICE PROGRESS NOTE   Cc:  MOREIRA,ROY, MD  DIAGNOSIS: Clinical diagnosis of JAK-2 mutation essential thrombocytosis.   CURRENT THERAPY: started on Hydrea 500mg  PO BID since Jan 2013.   INTERVAL HISTORY: Sarah Henson 78 y.o. female returns for regular follow up by herself.  She reported that ever since she started on Hydrea, she has been having fatigue; fatigue is not interfering with her ADLs.  She is still able to live by herself.  She reports pain to her left lower back that has been present for about 3 days; she is worried about having a reactivation of her shingles that she had about 3 years ago. She has not noted any rash. She does admit to being more active lately and is not sure if she may have pulled a muscle.  Patient denies headache, visual changes, confusion, drenching night sweats, palpable lymph node swelling, mucositis, odynophagia, dysphagia, nausea vomiting, jaundice, chest pain, palpitation, shortness of breath, dyspnea on exertion, productive cough, gum bleeding, epistaxis, hematemesis, hemoptysis, abdominal pain, abdominal swelling, early satiety, melena, hematochezia, hematuria, skin rash, spontaneous bleeding, joint swelling, joint pain, heat or cold intolerance, bowel bladder incontinence, back pain, focal motor weakness, paresthesia, depression, suicidal or homocidal ideation, feeling hopelessness.   Past Medical History  Diagnosis Date  . Hypertension   . Hyperlipidemia   . Osteopenia   . Herpes zoster   . Sinusitis   . Cervical spondylosis   . Thrombocytosis   . Essential thrombocytosis 08/27/2011    Past Surgical History  Procedure Date  . Left inguinal hernia repair   . Bilateral catarac surgery     Current Outpatient Prescriptions  Medication Sig Dispense Refill  . amLODipine-olmesartan (AZOR) 5-40 MG per tablet Take 1 tablet by mouth daily.        Marland Kitchen aspirin 81 MG tablet  Take 81 mg by mouth daily.        . calcium-vitamin D (OSCAL WITH D) 500-200 MG-UNIT per tablet Take 1 tablet by mouth daily.        . hydrochlorothiazide (MICROZIDE) 12.5 MG capsule Take 12.5 mg by mouth daily.        . hydroxyurea (HYDREA) 500 MG capsule Take 1 capsule (500 mg total) by mouth 2 (two) times daily. May take with food to minimize GI side effects.  180 capsule  2  . Multiple Vitamins-Minerals (CENTRUM SILVER PO) Take 1 tablet by mouth daily.        . nebivolol (BYSTOLIC) 5 MG tablet Take 5 mg by mouth daily.        . simvastatin (ZOCOR) 40 MG tablet Take 40 mg by mouth at bedtime.          ALLERGIES:   has no known allergies.  REVIEW OF SYSTEMS:  The rest of the 14-point review of system was negative.   Filed Vitals:   12/27/11 1351  BP: 128/77  Pulse: 72  Temp: 97.1 F (36.2 C)  Resp: 18   Wt Readings from Last 3 Encounters:  12/27/11 163 lb 1.6 oz (73.982 kg)  08/27/11 167 lb 3.2 oz (75.841 kg)  05/28/11 164 lb 12.8 oz (74.753 kg)   ECOG Performance status: 1  PHYSICAL EXAMINATION:  General:  well-nourished in no acute distress.  Eyes:  no scleral icterus.  ENT:  There were no oropharyngeal lesions.  Neck was without thyromegaly.  Lymphatics:  Negative cervical, supraclavicular or axillary adenopathy.  Respiratory: lungs were  clear bilaterally without wheezing or crackles.  Cardiovascular:  Regular rate and rhythm, S1/S2, without murmur, rub or gallop.  There was no pedal edema.  GI:  abdomen was soft, flat, nontender, nondistended, without organomegaly.  Muscoloskeletal:  no spinal tenderness of palpation of vertebral spine.  Skin exam was without echymosis, petichae.  No rashes noted. Neuro exam was nonfocal.  Patient was able to get on and off exam table without assistance.  Gait was normal.  Patient was alerted and oriented.  Attention was good.   Language was appropriate.  Mood was normal without depression.  Speech was not pressured.  Thought content was not  tangential.     LABORATORY/RADIOLOGY DATA:  Lab Results  Component Value Date   WBC 5.1 12/27/2011   HGB 12.0 12/27/2011   HCT 34.3* 12/27/2011   PLT 236 12/27/2011   GLUCOSE 105* 12/27/2011   ALKPHOS 56 12/27/2011   ALT 18 12/27/2011   AST 26 12/27/2011   NA 140 12/27/2011   K 4.2 12/27/2011   CL 102 12/27/2011   CREATININE 1.1 12/27/2011   BUN 23.0 12/27/2011   CO2 28 12/27/2011    ASSESSMENT AND PLAN:   1. Hypertension: She is on nebivolol, amlodipine/olmesartan, and HCTZ with good control per PCP.   2. Hyperlipidemia: She is on Zocor per PCP.   3. JAK-2 mutation Essential Thrombocytosis:  -She is having good response with Hydrea.  Her plt has normalized without leukocytopenia or anemia.  She has not had thrombotic complication from ET.  She queries about stopping Hydrea.  I expressed my concern that patients with ET who are older than 65, if left untreated has high risk of thrombotic complication.  I thus recommended her to continue Hydrea at current dose 500mg  PO BID along with aspirin 81mg  PO daily.   4. Back pain. I do not see any rash to indicate a reactivation of her shingles. I have recommended supportive measures at this time with follow-up with PCP if no improvement.  5. Follow up: Lab here at the [facility] in about 2 months.  She has return visit in about 4-5 months.    The length of time of the face-to-face encounter was 15 minutes. More than 50% of time was spent counseling and coordination of care.

## 2012-01-26 ENCOUNTER — Ambulatory Visit
Admission: RE | Admit: 2012-01-26 | Discharge: 2012-01-26 | Disposition: A | Discharge status: Home / Self Care | Payer: Medicare Other | Source: Ambulatory Visit | Attending: Internal Medicine | Admitting: Internal Medicine

## 2012-01-26 ENCOUNTER — Other Ambulatory Visit: Payer: Self-pay | Admitting: Internal Medicine

## 2012-01-26 DIAGNOSIS — R52 Pain, unspecified: Secondary | ICD-10-CM

## 2012-01-28 ENCOUNTER — Encounter: Payer: Self-pay | Admitting: *Deleted

## 2012-01-28 NOTE — Progress Notes (Signed)
CBC from 01/26/12 Received from Dr. Ludwig Clarks and forwarded to Dr. Gaylyn Rong for review.  Platelets 267, Hgb 11.7.

## 2012-02-25 ENCOUNTER — Other Ambulatory Visit (HOSPITAL_BASED_OUTPATIENT_CLINIC_OR_DEPARTMENT_OTHER): Payer: Medicare Other | Admitting: Lab

## 2012-02-25 DIAGNOSIS — D473 Essential (hemorrhagic) thrombocythemia: Secondary | ICD-10-CM

## 2012-02-25 LAB — COMPREHENSIVE METABOLIC PANEL (CC13)
ALT: 16 U/L (ref 0–55)
AST: 24 U/L (ref 5–34)
Creatinine: 0.9 mg/dL (ref 0.6–1.1)
Sodium: 140 mEq/L (ref 136–145)
Total Bilirubin: 0.9 mg/dL (ref 0.20–1.20)

## 2012-02-25 LAB — CBC WITH DIFFERENTIAL/PLATELET
BASO%: 0.6 % (ref 0.0–2.0)
EOS%: 1 % (ref 0.0–7.0)
HCT: 34.8 % (ref 34.8–46.6)
LYMPH%: 36.7 % (ref 14.0–49.7)
MCH: 42.5 pg — ABNORMAL HIGH (ref 25.1–34.0)
MCHC: 34.5 g/dL (ref 31.5–36.0)
NEUT%: 52 % (ref 38.4–76.8)
Platelets: 244 10*3/uL (ref 145–400)
RBC: 2.83 10*6/uL — ABNORMAL LOW (ref 3.70–5.45)
lymph#: 1.6 10*3/uL (ref 0.9–3.3)

## 2012-04-24 ENCOUNTER — Telehealth: Payer: Self-pay | Admitting: Oncology

## 2012-04-24 ENCOUNTER — Other Ambulatory Visit (HOSPITAL_BASED_OUTPATIENT_CLINIC_OR_DEPARTMENT_OTHER): Payer: Medicare Other | Admitting: Lab

## 2012-04-24 ENCOUNTER — Ambulatory Visit (HOSPITAL_BASED_OUTPATIENT_CLINIC_OR_DEPARTMENT_OTHER): Payer: Medicare Other | Admitting: Oncology

## 2012-04-24 VITALS — BP 154/76 | HR 82 | Temp 97.8°F | Resp 18 | Ht 60.0 in | Wt 160.6 lb

## 2012-04-24 DIAGNOSIS — D473 Essential (hemorrhagic) thrombocythemia: Secondary | ICD-10-CM

## 2012-04-24 DIAGNOSIS — I10 Essential (primary) hypertension: Secondary | ICD-10-CM

## 2012-04-24 DIAGNOSIS — E785 Hyperlipidemia, unspecified: Secondary | ICD-10-CM

## 2012-04-24 LAB — COMPREHENSIVE METABOLIC PANEL (CC13)
ALT: 18 U/L (ref 0–55)
AST: 24 U/L (ref 5–34)
CO2: 29 mEq/L (ref 22–29)
Creatinine: 1 mg/dL (ref 0.6–1.1)
Total Bilirubin: 0.94 mg/dL (ref 0.20–1.20)

## 2012-04-24 LAB — CBC WITH DIFFERENTIAL/PLATELET
BASO%: 0.8 % (ref 0.0–2.0)
EOS%: 1.1 % (ref 0.0–7.0)
HCT: 34.9 % (ref 34.8–46.6)
LYMPH%: 31.3 % (ref 14.0–49.7)
MCH: 42.6 pg — ABNORMAL HIGH (ref 25.1–34.0)
MCHC: 34.9 g/dL (ref 31.5–36.0)
MCV: 122.2 fL — ABNORMAL HIGH (ref 79.5–101.0)
MONO%: 9.2 % (ref 0.0–14.0)
NEUT%: 57.6 % (ref 38.4–76.8)
Platelets: 243 10*3/uL (ref 145–400)

## 2012-04-24 NOTE — Telephone Encounter (Signed)
Gave pt appt for June 2014 lab and ML

## 2012-04-24 NOTE — Patient Instructions (Addendum)
1.  Diagnosis:  Essential Thrombocytosis (ET). 2.  Treatment:    *  Hydrea and Aspirin to decrease risk of blood clot (including stroke, heart attack).   3.  Follow up:  Lab only appointment in about 3 months.  Return visit in about 6 months.

## 2012-04-24 NOTE — Progress Notes (Signed)
[facility]  Telephone:([phone] Fax:(336) [phone]   OFFICE PROGRESS NOTE   Cc:  MOREIRA,ROY, MD  DIAGNOSIS: Clinical diagnosis of JAK-2 mutation negative essential thrombocytosis.   CURRENT THERAPY: started on Hydrea 500mg  PO BID since Jan 2013.   INTERVAL HISTORY: Sarah Henson 78 y.o. female returns for regular follow up by herself.  She reported feeling well.  She still drives.  In fact, she drives about 30 miles every weekend to go to church. She drove herself to the clinic today.  She denied headache, visual changes, leg pain/swelling, abdominal pain, bleeding symptoms, skin rash. She has been having insomnia this week which she attributes to getting ready for Christmas.  The rest of the 14-point review of system was negative.  She is driving with her son to [place] for Christmas week.    Past Medical History  Diagnosis Date  . Hypertension   . Hyperlipidemia   . Osteopenia   . Herpes zoster   . Sinusitis   . Cervical spondylosis   . Thrombocytosis   . Essential thrombocytosis 08/27/2011    Past Surgical History  Procedure Date  . Left inguinal hernia repair   . Bilateral catarac surgery     Current Outpatient Prescriptions  Medication Sig Dispense Refill  . amLODipine-olmesartan (AZOR) 5-40 MG per tablet Take 1 tablet by mouth daily.        Marland Kitchen aspirin 81 MG tablet Take 81 mg by mouth daily.        . calcium-vitamin D (OSCAL WITH D) 500-200 MG-UNIT per tablet Take 1 tablet by mouth daily.        . hydrochlorothiazide (MICROZIDE) 12.5 MG capsule Take 12.5 mg by mouth daily.        . hydroxyurea (HYDREA) 500 MG capsule Take 1 capsule (500 mg total) by mouth 2 (two) times daily. May take with food to minimize GI side effects.  180 capsule  2  . Multiple Vitamins-Minerals (CENTRUM SILVER PO) Take 1 tablet by mouth daily.        . nebivolol (BYSTOLIC) 5 MG tablet Take 5 mg by mouth daily.        . simvastatin (ZOCOR) 40 MG tablet Take 40 mg by  mouth at bedtime.          ALLERGIES:   has no known allergies.  REVIEW OF SYSTEMS:  The rest of the 14-point review of system was negative.   Filed Vitals:   04/24/12 1025  BP: 154/76  Pulse: 82  Temp: 97.8 F (36.6 C)  Resp: 18   Wt Readings from Last 3 Encounters:  04/24/12 160 lb 9.6 oz (72.848 kg)  12/27/11 163 lb 1.6 oz (73.982 kg)  08/27/11 167 lb 3.2 oz (75.841 kg)   ECOG Performance status: 0-1  PHYSICAL EXAMINATION:  General:  well-nourished woman, in no acute distress.  Eyes:  no scleral icterus.  ENT:  There were no oropharyngeal lesions.  Neck was without thyromegaly.  Lymphatics:  Negative cervical, supraclavicular or axillary adenopathy.  Respiratory: lungs were clear bilaterally without wheezing or crackles.  Cardiovascular:  Regular rate and rhythm, S1/S2, without murmur, rub or gallop.  There was no pedal edema.  GI:  abdomen was soft, flat, nontender, nondistended, without organomegaly.  Muscoloskeletal:  no spinal tenderness of palpation of vertebral spine.  Skin exam was without echymosis, petichae.  Neuro exam was nonfocal.  Patient was able to get on and off exam table without assistance.  Gait was normal.  Patient was alerted and oriented.  Attention was good.   Language was appropriate.  Mood was normal without depression.  Speech was not pressured.  Thought content was not tangential.     LABORATORY/RADIOLOGY DATA:  Lab Results  Component Value Date   WBC 5.0 04/24/2012   HGB 12.2 04/24/2012   HCT 34.9 04/24/2012   PLT 243 04/24/2012   GLUCOSE 99 04/24/2012   ALKPHOS 51 04/24/2012   ALT 18 04/24/2012   AST 24 04/24/2012   NA 142 04/24/2012   K 4.1 04/24/2012   CL 103 04/24/2012   CREATININE 1.0 04/24/2012   BUN 23.0 04/24/2012   CO2 29 04/24/2012    ASSESSMENT AND PLAN:   1. Hypertension: She is on nebivolol, amlodipine/olmesartan, and HCTZ per PCP.   2. Hyperlipidemia: She is on Zocor per PCP.   3. JAK-2 mutation negative Essential  Thrombocytosis:  -normalization of platelet count with Hydrea without side effects.  She again asked if Hydrea needs to be lifelong.  I recommended again lifelong Hydrea and Aspirin 81mg  PO daily to decrease risk of thrombosis including stroke and heart attack.   - She agreed with this recommendation for now.   4. Follow up: Lab here at the [facility] in about 3 months.  She has return visit in about 6 months.    The length of time of the face-to-face encounter was . More than 50% of time was spent counseling and coordination of care.

## 2012-05-15 ENCOUNTER — Encounter: Payer: Self-pay | Admitting: *Deleted

## 2012-05-15 NOTE — Progress Notes (Signed)
Labs received from Dr. Ludwig Clarks and forwarded to Dr. Gaylyn Rong.   WBC 3.8,  Hgb 12.3.

## 2012-07-13 ENCOUNTER — Other Ambulatory Visit: Payer: Self-pay | Admitting: *Deleted

## 2012-07-13 DIAGNOSIS — D473 Essential (hemorrhagic) thrombocythemia: Secondary | ICD-10-CM

## 2012-07-13 MED ORDER — HYDROXYUREA 500 MG PO CAPS: 500.0000 mg | ORAL_CAPSULE | Freq: Two times a day (BID) | ORAL | Status: DC

## 2012-10-16 ENCOUNTER — Other Ambulatory Visit: Payer: Self-pay | Admitting: *Deleted

## 2012-10-16 DIAGNOSIS — D473 Essential (hemorrhagic) thrombocythemia: Secondary | ICD-10-CM

## 2012-10-16 MED ORDER — HYDROXYUREA 500 MG PO CAPS: 500.0000 mg | ORAL_CAPSULE | Freq: Two times a day (BID) | ORAL | Status: DC

## 2012-10-23 ENCOUNTER — Telehealth: Payer: Self-pay | Admitting: Oncology

## 2012-10-23 ENCOUNTER — Ambulatory Visit (HOSPITAL_BASED_OUTPATIENT_CLINIC_OR_DEPARTMENT_OTHER): Payer: Medicare Other | Admitting: Oncology

## 2012-10-23 ENCOUNTER — Encounter: Payer: Self-pay | Admitting: Oncology

## 2012-10-23 ENCOUNTER — Other Ambulatory Visit (HOSPITAL_BASED_OUTPATIENT_CLINIC_OR_DEPARTMENT_OTHER): Payer: Medicare Other | Admitting: Lab

## 2012-10-23 VITALS — BP 131/87 | HR 69 | Temp 97.4°F | Resp 18 | Ht 60.0 in | Wt 158.3 lb

## 2012-10-23 DIAGNOSIS — D473 Essential (hemorrhagic) thrombocythemia: Secondary | ICD-10-CM

## 2012-10-23 LAB — COMPREHENSIVE METABOLIC PANEL (CC13)
Albumin: 3.8 g/dL (ref 3.5–5.0)
Alkaline Phosphatase: 51 U/L (ref 40–150)
Glucose: 97 mg/dl (ref 70–99)
Potassium: 3.9 mEq/L (ref 3.5–5.1)
Sodium: 141 mEq/L (ref 136–145)
Total Protein: 7.8 g/dL (ref 6.4–8.3)

## 2012-10-23 LAB — CBC WITH DIFFERENTIAL/PLATELET
Eosinophils Absolute: 0 10*3/uL (ref 0.0–0.5)
MCV: 122.3 fL — ABNORMAL HIGH (ref 79.5–101.0)
MONO#: 0.5 10*3/uL (ref 0.1–0.9)
MONO%: 11.5 % (ref 0.0–14.0)
NEUT#: 2.3 10*3/uL (ref 1.5–6.5)
RBC: 2.7 10*6/uL — ABNORMAL LOW (ref 3.70–5.45)
RDW: 13.7 % (ref 11.2–14.5)
WBC: 4.4 10*3/uL (ref 3.9–10.3)

## 2012-10-23 MED ORDER — HYDROXYUREA 500 MG PO CAPS: 500.0000 mg | ORAL_CAPSULE | Freq: Two times a day (BID) | ORAL | Status: DC

## 2012-10-23 NOTE — Progress Notes (Signed)
[facility]  Telephone:(336) [phone] Fax:(336) [phone]   OFFICE PROGRESS NOTE   Cc:  MOREIRA,ROY, MD  DIAGNOSIS: Clinical diagnosis of JAK-2 mutation negative essential thrombocytosis.   CURRENT THERAPY: started on Hydrea 500mg  PO BID since Jan 2013.   INTERVAL HISTORY: Sarah Henson 79 y.o. female returns for regular follow up by herself.  She reported feeling well.  She still drives.  In fact, she drives about 30 miles every weekend to go to church. She drove herself to the clinic today.  She denied headache, visual changes, leg pain/swelling, abdominal pain, bleeding symptoms, skin rash. Since we last saw her she was treated for cellulitis of the right ankle. Her infection is now resolved, but she developed C. difficile following her antibiotics and was treated with metronidazole about one month ago. Her diarrhea has now resolved. The rest of the 14-point review of system was negative.     Past Medical History  Diagnosis Date  . Hypertension   . Hyperlipidemia   . Osteopenia   . Herpes zoster   . Sinusitis   . Cervical spondylosis   . Thrombocytosis   . Essential thrombocytosis 08/27/2011    Past Surgical History  Procedure Laterality Date  . Left inguinal hernia repair    . Bilateral catarac surgery      Current Outpatient Prescriptions  Medication Sig Dispense Refill  . amLODipine-olmesartan (AZOR) 5-40 MG per tablet Take 1 tablet by mouth daily.        Marland Kitchen aspirin 81 MG tablet Take 81 mg by mouth daily.        . calcium-vitamin D (OSCAL WITH D) 500-200 MG-UNIT per tablet Take 1 tablet by mouth daily.        . hydrochlorothiazide (MICROZIDE) 12.5 MG capsule Take 12.5 mg by mouth daily.        . hydroxyurea (HYDREA) 500 MG capsule Take 1 capsule (500 mg total) by mouth 2 (two) times daily. May take with food to minimize GI side effects.  30 capsule  5  . Multiple Vitamins-Minerals (CENTRUM SILVER PO) Take 1 tablet by mouth daily.        . nebivolol  (BYSTOLIC) 5 MG tablet Take 5 mg by mouth daily.        . simvastatin (ZOCOR) 40 MG tablet Take 40 mg by mouth at bedtime.         No current facility-administered medications for this visit.    ALLERGIES:  has No Known Allergies.  REVIEW OF SYSTEMS:  The rest of the 14-point review of system was negative.   Filed Vitals:   10/23/12 1117  BP: 131/87  Pulse: 69  Temp: 97.4 F (36.3 C)  Resp: 18   Wt Readings from Last 3 Encounters:  10/23/12 158 lb 4.8 oz (71.804 kg)  04/24/12 160 lb 9.6 oz (72.848 kg)  12/27/11 163 lb 1.6 oz (73.982 kg)   ECOG Performance status: 0-1  PHYSICAL EXAMINATION:  General:  well-nourished woman, in no acute distress.  Eyes:  no scleral icterus.  ENT:  There were no oropharyngeal lesions.  Neck was without thyromegaly.  Lymphatics:  Negative cervical, supraclavicular or axillary adenopathy.  Respiratory: lungs were clear bilaterally without wheezing or crackles.  Cardiovascular:  Regular rate and rhythm, S1/S2, without murmur, rub or gallop.  There was no pedal edema.  GI:  abdomen was soft, flat, nontender, nondistended, without organomegaly.  Muscoloskeletal:  no spinal tenderness of palpation of vertebral spine.  Skin exam was  without echymosis, petichae.  Neuro exam was nonfocal.  Patient was able to get on and off exam table without assistance.  Gait was normal.  Patient was alerted and oriented.  Attention was good.   Language was appropriate.  Mood was normal without depression.  Speech was not pressured.  Thought content was not tangential.     LABORATORY/RADIOLOGY DATA:  Lab Results  Component Value Date   WBC 4.4 10/23/2012   HGB 11.6 10/23/2012   HCT 33.0* 10/23/2012   PLT 223 10/23/2012   GLUCOSE 97 10/23/2012   ALKPHOS 51 10/23/2012   ALT 17 10/23/2012   AST 26 10/23/2012   NA 141 10/23/2012   K 3.9 10/23/2012   CL 105 10/23/2012   CREATININE 1.0 10/23/2012   BUN 24.1 10/23/2012   CO2 27 10/23/2012    ASSESSMENT AND PLAN:   1. Hypertension:  She is on nebivolol, amlodipine/olmesartan, and HCTZ per PCP.   2. Hyperlipidemia: She is on Zocor per PCP.   3. JAK-2 mutation negative Essential Thrombocytosis:  -normalization of platelet count with Hydrea without side effects.  She again asked if Hydrea needs to be lifelong.  I recommended again lifelong Hydrea and Aspirin 81mg  PO daily to decrease risk of thrombosis including stroke and heart attack.   - She agreed with this recommendation for now.   4. Follow up: Lab here at the [facility] in about 3 months.  She has return visit in about 6 months.    The length of time of the face-to-face encounter was . More than 50% of time was spent counseling and coordination of care.

## 2012-10-23 NOTE — Telephone Encounter (Signed)
Gave pt appt for lab on September then see MD on December 2014

## 2013-01-04 ENCOUNTER — Other Ambulatory Visit: Payer: Self-pay | Admitting: *Deleted

## 2013-01-04 DIAGNOSIS — D473 Essential (hemorrhagic) thrombocythemia: Secondary | ICD-10-CM

## 2013-01-04 MED ORDER — HYDROXYUREA 500 MG PO CAPS: 500.0000 mg | ORAL_CAPSULE | Freq: Two times a day (BID) | ORAL | Status: AC

## 2013-01-24 ENCOUNTER — Other Ambulatory Visit (HOSPITAL_BASED_OUTPATIENT_CLINIC_OR_DEPARTMENT_OTHER): Payer: Medicare Other

## 2013-01-24 DIAGNOSIS — D473 Essential (hemorrhagic) thrombocythemia: Secondary | ICD-10-CM

## 2013-01-24 LAB — CBC WITH DIFFERENTIAL/PLATELET
Basophils Absolute: 0 10*3/uL (ref 0.0–0.1)
Eosinophils Absolute: 0 10*3/uL (ref 0.0–0.5)
HCT: 33.6 % — ABNORMAL LOW (ref 34.8–46.6)
HGB: 11.6 g/dL (ref 11.6–15.9)
MCV: 122.2 fL — ABNORMAL HIGH (ref 79.5–101.0)
MONO%: 8.7 % (ref 0.0–14.0)
NEUT#: 3.1 10*3/uL (ref 1.5–6.5)
NEUT%: 60.9 % (ref 38.4–76.8)
RDW: 12.6 % (ref 11.2–14.5)
lymph#: 1.5 10*3/uL (ref 0.9–3.3)

## 2013-02-27 ENCOUNTER — Telehealth: Payer: Self-pay

## 2013-02-27 NOTE — Telephone Encounter (Signed)
Sarah Henson from [facility] called. The pt's PCP had suggested she get the shingles vaccine. Sarah Henson noted the pt was on hydrea and called to verify safety. Per Dr Rosie Fate the pt should not get a shingles vaccine because hydrea can cause neutropenia. She has an appt at [facility] on 12/19 and she can ask Dr Rosie Fate at that time about the shingles shot, in light of labs being done then. Sarah Henson said he would call the pt with this information.

## 2013-04-20 ENCOUNTER — Other Ambulatory Visit (HOSPITAL_BASED_OUTPATIENT_CLINIC_OR_DEPARTMENT_OTHER): Payer: Medicare Other

## 2013-04-20 ENCOUNTER — Ambulatory Visit (HOSPITAL_BASED_OUTPATIENT_CLINIC_OR_DEPARTMENT_OTHER): Payer: Medicare Other | Admitting: Internal Medicine

## 2013-04-20 ENCOUNTER — Telehealth: Payer: Self-pay | Admitting: Internal Medicine

## 2013-04-20 VITALS — BP 140/81 | HR 70 | Temp 97.2°F | Resp 18 | Ht 60.0 in | Wt 153.9 lb

## 2013-04-20 DIAGNOSIS — I10 Essential (primary) hypertension: Secondary | ICD-10-CM

## 2013-04-20 DIAGNOSIS — D75839 Thrombocytosis, unspecified: Secondary | ICD-10-CM

## 2013-04-20 DIAGNOSIS — E785 Hyperlipidemia, unspecified: Secondary | ICD-10-CM

## 2013-04-20 DIAGNOSIS — M899 Disorder of bone, unspecified: Secondary | ICD-10-CM

## 2013-04-20 DIAGNOSIS — D473 Essential (hemorrhagic) thrombocythemia: Secondary | ICD-10-CM

## 2013-04-20 DIAGNOSIS — M8580 Other specified disorders of bone density and structure, unspecified site: Secondary | ICD-10-CM

## 2013-04-20 LAB — CBC WITH DIFFERENTIAL/PLATELET
Basophils Absolute: 0 10*3/uL (ref 0.0–0.1)
Eosinophils Absolute: 0 10*3/uL (ref 0.0–0.5)
HGB: 11.7 g/dL (ref 11.6–15.9)
LYMPH%: 26.5 % (ref 14.0–49.7)
MCV: 124.5 fL — ABNORMAL HIGH (ref 79.5–101.0)
MONO%: 6.2 % (ref 0.0–14.0)
NEUT#: 3.7 10*3/uL (ref 1.5–6.5)
Platelets: 252 10*3/uL (ref 145–400)

## 2013-04-20 LAB — COMPREHENSIVE METABOLIC PANEL (CC13)
Alkaline Phosphatase: 54 U/L (ref 40–150)
BUN: 23.3 mg/dL (ref 7.0–26.0)
Creatinine: 1 mg/dL (ref 0.6–1.1)
Glucose: 115 mg/dl (ref 70–140)
Total Bilirubin: 0.87 mg/dL (ref 0.20–1.20)

## 2013-04-20 NOTE — Progress Notes (Signed)
[facility] OFFICE PROGRESS NOTE  Sarah Ok, MD [address] [place] [address]  DIAGNOSIS: Essential thrombocytosis  Thrombocytosis  Osteopenia  Hypertension  Hyperlipidemia  Chief Complaint  Patient presents with  . Essential thrombocytosis    CURRENT THERAPY: Started on Hydrea 500mg  PO BID since Jan 2013.   INTERVAL HISTORY:  Sarah Henson 79 y.o. female returns for regular follow up by herself. She reported feeling well. She still drives. In fact, she drives about 30 miles every weekend to go to church. She continues to drive herself to the clinic today. She denied headache, visual changes, leg pain/swelling, abdominal pain, bleeding symptoms, skin rash. She reports that she aged out of her mammograms.  Her energy and appetite is good.  She has seen her primary care physician Dr. Ludwig Clarks without changes in her medications.  The rest of the 14-point review of system was negative.    MEDICAL HISTORY: Past Medical History  Diagnosis Date  . Hypertension   . Hyperlipidemia   . Osteopenia   . Herpes zoster   . Sinusitis   . Cervical spondylosis   . Thrombocytosis   . Essential thrombocytosis 08/27/2011    INTERIM HISTORY: has Hypertension; Hyperlipidemia; Osteopenia; Thrombocytosis; and Essential thrombocytosis on her problem list.    ALLERGIES:  has No Known Allergies.  MEDICATIONS: has a current medication list which includes the following prescription(s): amlodipine-olmesartan, aspirin, calcium-vitamin d, hydrochlorothiazide, hydroxyurea, multiple vitamins-minerals, nebivolol, and simvastatin.  SURGICAL HISTORY:  Past Surgical History  Procedure Laterality Date  . Left inguinal hernia repair    . Bilateral catarac surgery      REVIEW OF SYSTEMS:   Constitutional: Denies fevers, chills or abnormal weight loss Eyes: Denies blurriness of vision Ears, nose, mouth, throat, and face: Denies mucositis or sore throat Respiratory: Denies  cough, dyspnea or wheezes Cardiovascular: Denies palpitation, chest discomfort or lower extremity swelling Gastrointestinal:  Denies nausea, heartburn or change in bowel habits Skin: Denies abnormal skin rashes Lymphatics: Denies new lymphadenopathy or easy bruising Neurological:Denies numbness, tingling or new weaknesses Behavioral/Psych: Mood is stable, no new changes  All other systems were reviewed with the patient and are negative.  PHYSICAL EXAMINATION: ECOG PERFORMANCE STATUS: 0 - Asymptomatic  Blood pressure 140/81, pulse 70, temperature 97.2 F (36.2 C), temperature source Oral, resp. rate 18, height 5' (1.524 m), weight 153 lb 14.4 oz (69.809 kg).  GENERAL:alert, no distress and comfortable, pleasant elderly female who appears her stated age SKIN: skin color, texture, turgor are normal, no rashes or significant lesions EYES: normal, Conjunctiva are pink and non-injected, sclera clear OROPHARYNX:no exudate, no erythema and lips, buccal mucosa, and tongue normal  NECK: supple, thyroid normal size, non-tender, without nodularity LYMPH:  no palpable lymphadenopathy in the cervical, axillary or supraclavicular LUNGS: clear to auscultation and percussion with normal breathing effort HEART: regular rate & rhythm and no murmurs and no lower extremity edema ABDOMEN:abdomen soft, non-tender and normal bowel sounds Musculoskeletal:no cyanosis of digits and no clubbing  NEURO: alert & oriented x 3 with fluent speech, no focal motor/sensory deficits; gait normal.    LABORATORY DATA: Results for orders placed in visit on 04/20/13 (from the past 48 hour(s))  CBC WITH DIFFERENTIAL     Status: Abnormal   Collection Time    04/20/13 12:27 PM      Result Value Range   WBC 5.6  3.9 - 10.3 10e3/uL   NEUT# 3.7  1.5 - 6.5 10e3/uL   HGB 11.7  11.6 - 15.9 g/dL  HCT 34.4 (*) 34.8 - 46.6 %   Platelets 252  145 - 400 10e3/uL   MCV 124.5 (*) 79.5 - 101.0 fL   MCH 42.2 (*) 25.1 - 34.0 pg   MCHC  33.9  31.5 - 36.0 g/dL   RBC 1.61 (*) 0.96 - 0.45 10e6/uL   RDW 12.6  11.2 - 14.5 %   lymph# 1.5  0.9 - 3.3 10e3/uL   MONO# 0.3  0.1 - 0.9 10e3/uL   Eosinophils Absolute 0.0  0.0 - 0.5 10e3/uL   Basophils Absolute 0.0  0.0 - 0.1 10e3/uL   NEUT% 65.9  38.4 - 76.8 %   LYMPH% 26.5  14.0 - 49.7 %   MONO% 6.2  0.0 - 14.0 %   EOS% 0.7  0.0 - 7.0 %   BASO% 0.7  0.0 - 2.0 %       Labs:  Lab Results  Component Value Date   WBC 5.6 04/20/2013   HGB 11.7 04/20/2013   HCT 34.4* 04/20/2013   MCV 124.5* 04/20/2013   PLT 252 04/20/2013   NEUTROABS 3.7 04/20/2013      Chemistry      Component Value Date/Time   NA 141 10/23/2012 1054   NA 140 10/27/2011 1329   K 3.9 10/23/2012 1054   K 4.2 10/27/2011 1329   CL 105 10/23/2012 1054   CL 102 10/27/2011 1329   CO2 27 10/23/2012 1054   CO2 27 10/27/2011 1329   BUN 24.1 10/23/2012 1054   BUN 21 10/27/2011 1329   CREATININE 1.0 10/23/2012 1054   CREATININE 1.05 10/27/2011 1329      Component Value Date/Time   CALCIUM 9.9 10/23/2012 1054   CALCIUM 9.7 10/27/2011 1329   ALKPHOS 51 10/23/2012 1054   ALKPHOS 48 10/27/2011 1329   AST 26 10/23/2012 1054   AST 28 10/27/2011 1329   ALT 17 10/23/2012 1054   ALT 19 10/27/2011 1329   BILITOT 0.72 10/23/2012 1054   BILITOT 1.1 10/27/2011 1329     CBC:  Recent Labs Lab 04/20/13 1227  WBC 5.6  NEUTROABS 3.7  HGB 11.7  HCT 34.4*  MCV 124.5*  PLT 252    RADIOGRAPHIC STUDIES: No results found.  ASSESSMENT: Sarah Henson 79 y.o. female with a history of Essential thrombocytosis  Thrombocytosis  Osteopenia  Hypertension  Hyperlipidemia   PLAN:   1. Hypertension: She is on nebivolol, amlodipine/olmesartan, and HCTZ per PCP.   2. Hyperlipidemia: She is on Zocor per PCP.   3. JAK-2 mutation negative Essential Thrombocytosis:  -normalization of platelet count with Hydrea without side effects. She again asked if Hydrea needs to be lifelong. I recommended again lifelong Hydrea and Aspirin 81mg  PO  daily to decrease risk of thrombosis including stroke and heart attack.  - She agreed with this recommendation for now.   4. Follow up: Lab here at the [facility] in about 3 months. She has return visit in about 6 months.   All questions were answered. The patient knows to call the clinic with any problems, questions or concerns. We can certainly see the patient much sooner if necessary.  I spent 10 minutes counseling the patient face to face. The total time spent in the appointment was 15 minutes.    Dashton Czerwinski, MD 04/20/2013 1:06 PM

## 2013-04-20 NOTE — Telephone Encounter (Signed)
appts made per 12/19 POF AVS and CAL printed shh °

## 2013-05-07 ENCOUNTER — Telehealth: Payer: Self-pay | Admitting: Medical Oncology

## 2013-05-07 NOTE — Telephone Encounter (Signed)
Pt saw her primary care MD in the fall and he gave her a script for a shingles vaccine. When she went to the pharmacist to get her suggested she ask Dr. Juliann Mule if it is safe for her to get. She saw Dr. Juliann Mule 04/20/13 but she failed to ask him. I will ask Dr. Juliann Mule and call her back. She voiced understanding.

## 2013-05-09 ENCOUNTER — Telehealth: Payer: Self-pay

## 2013-05-09 NOTE — Telephone Encounter (Signed)
S/w pt that Dr Juliann Mule wants her to NOT get the shingles shot at this time. She is taking immunosuppressant Hydrea and would not get a robust response to the immunization.

## 2013-07-19 ENCOUNTER — Other Ambulatory Visit (HOSPITAL_BASED_OUTPATIENT_CLINIC_OR_DEPARTMENT_OTHER): Payer: Medicare Other

## 2013-07-19 DIAGNOSIS — D473 Essential (hemorrhagic) thrombocythemia: Secondary | ICD-10-CM

## 2013-07-19 LAB — CBC WITH DIFFERENTIAL/PLATELET
BASO%: 0.9 % (ref 0.0–2.0)
BASOS ABS: 0 10*3/uL (ref 0.0–0.1)
EOS%: 1.4 % (ref 0.0–7.0)
Eosinophils Absolute: 0.1 10*3/uL (ref 0.0–0.5)
HCT: 33.7 % — ABNORMAL LOW (ref 34.8–46.6)
HEMOGLOBIN: 11.2 g/dL — AB (ref 11.6–15.9)
LYMPH#: 1.6 10*3/uL (ref 0.9–3.3)
LYMPH%: 34.6 % (ref 14.0–49.7)
MCH: 40.8 pg — AB (ref 25.1–34.0)
MCHC: 33.3 g/dL (ref 31.5–36.0)
MCV: 122.3 fL — AB (ref 79.5–101.0)
MONO#: 0.4 10*3/uL (ref 0.1–0.9)
MONO%: 10 % (ref 0.0–14.0)
NEUT#: 2.4 10*3/uL (ref 1.5–6.5)
NEUT%: 53.1 % (ref 38.4–76.8)
Platelets: 237 10*3/uL (ref 145–400)
RBC: 2.76 10*6/uL — ABNORMAL LOW (ref 3.70–5.45)
RDW: 13.6 % (ref 11.2–14.5)
WBC: 4.5 10*3/uL (ref 3.9–10.3)

## 2013-09-12 ENCOUNTER — Other Ambulatory Visit: Payer: Self-pay | Admitting: Internal Medicine

## 2013-09-12 ENCOUNTER — Ambulatory Visit
Admission: RE | Admit: 2013-09-12 | Discharge: 2013-09-12 | Disposition: A | Discharge status: Home / Self Care | Payer: Medicare HMO | Source: Ambulatory Visit | Attending: Internal Medicine | Admitting: Internal Medicine

## 2013-09-12 DIAGNOSIS — M25551 Pain in right hip: Secondary | ICD-10-CM

## 2013-09-18 ENCOUNTER — Other Ambulatory Visit: Payer: Medicare Other

## 2013-10-19 ENCOUNTER — Ambulatory Visit (HOSPITAL_BASED_OUTPATIENT_CLINIC_OR_DEPARTMENT_OTHER): Payer: Medicare HMO | Admitting: Internal Medicine

## 2013-10-19 ENCOUNTER — Encounter: Payer: Self-pay | Admitting: Internal Medicine

## 2013-10-19 ENCOUNTER — Other Ambulatory Visit (HOSPITAL_BASED_OUTPATIENT_CLINIC_OR_DEPARTMENT_OTHER): Payer: Commercial Managed Care - HMO

## 2013-10-19 VITALS — BP 105/61 | HR 74 | Temp 98.1°F | Resp 18 | Ht 60.0 in | Wt 144.6 lb

## 2013-10-19 DIAGNOSIS — D473 Essential (hemorrhagic) thrombocythemia: Secondary | ICD-10-CM

## 2013-10-19 DIAGNOSIS — I10 Essential (primary) hypertension: Secondary | ICD-10-CM

## 2013-10-19 DIAGNOSIS — E785 Hyperlipidemia, unspecified: Secondary | ICD-10-CM

## 2013-10-19 LAB — COMPREHENSIVE METABOLIC PANEL (CC13)
ALT: 15 U/L (ref 0–55)
AST: 26 U/L (ref 5–34)
Albumin: 4.1 g/dL (ref 3.5–5.0)
Alkaline Phosphatase: 45 U/L (ref 40–150)
Anion Gap: 10 mEq/L (ref 3–11)
BUN: 32.7 mg/dL — AB (ref 7.0–26.0)
CALCIUM: 10.8 mg/dL — AB (ref 8.4–10.4)
CO2: 28 meq/L (ref 22–29)
CREATININE: 1.4 mg/dL — AB (ref 0.6–1.1)
Chloride: 103 mEq/L (ref 98–109)
Glucose: 105 mg/dl (ref 70–140)
POTASSIUM: 3.8 meq/L (ref 3.5–5.1)
Sodium: 141 mEq/L (ref 136–145)
Total Bilirubin: 0.75 mg/dL (ref 0.20–1.20)
Total Protein: 7.5 g/dL (ref 6.4–8.3)

## 2013-10-19 LAB — CBC WITH DIFFERENTIAL/PLATELET
BASO%: 0.8 % (ref 0.0–2.0)
BASOS ABS: 0 10*3/uL (ref 0.0–0.1)
EOS%: 2.1 % (ref 0.0–7.0)
Eosinophils Absolute: 0.1 10*3/uL (ref 0.0–0.5)
HEMATOCRIT: 32.9 % — AB (ref 34.8–46.6)
HEMOGLOBIN: 11.1 g/dL — AB (ref 11.6–15.9)
LYMPH%: 42.6 % (ref 14.0–49.7)
MCH: 41.9 pg — AB (ref 25.1–34.0)
MCHC: 33.8 g/dL (ref 31.5–36.0)
MCV: 124.2 fL — ABNORMAL HIGH (ref 79.5–101.0)
MONO#: 0.4 10*3/uL (ref 0.1–0.9)
MONO%: 8.3 % (ref 0.0–14.0)
NEUT%: 46.2 % (ref 38.4–76.8)
NEUTROS ABS: 2.1 10*3/uL (ref 1.5–6.5)
Platelets: 205 10*3/uL (ref 145–400)
RBC: 2.65 10*6/uL — ABNORMAL LOW (ref 3.70–5.45)
RDW: 14 % (ref 11.2–14.5)
WBC: 4.6 10*3/uL (ref 3.9–10.3)
lymph#: 2 10*3/uL (ref 0.9–3.3)

## 2013-10-19 LAB — LACTATE DEHYDROGENASE (CC13): LDH: 207 U/L (ref 125–245)

## 2013-10-19 NOTE — Patient Instructions (Signed)
Dehydration, Adult Dehydration is when you lose more fluids from the body than you take in. Vital organs like the kidneys, brain, and heart cannot function without a proper amount of fluids and salt. Any loss of fluids from the body can cause dehydration.  CAUSES   Vomiting.  Diarrhea.  Excessive sweating.  Excessive urine output.  Fever. SYMPTOMS  Mild dehydration  Thirst.  Dry lips.  Slightly dry mouth. Moderate dehydration  Very dry mouth.  Sunken eyes.  Skin does not bounce back quickly when lightly pinched and released.  Dark urine and decreased urine production.  Decreased tear production.  Headache. Severe dehydration  Very dry mouth.  Extreme thirst.  Rapid, weak pulse (more than 100 beats per minute at rest).  Cold hands and feet.  Not able to sweat in spite of heat and temperature.  Rapid breathing.  Blue lips.  Confusion and lethargy.  Difficulty being awakened.  Minimal urine production.  No tears. DIAGNOSIS  Your caregiver will diagnose dehydration based on your symptoms and your exam. Blood and urine tests will help confirm the diagnosis. The diagnostic evaluation should also identify the cause of dehydration. TREATMENT  Treatment of mild or moderate dehydration can often be done at home by increasing the amount of fluids that you drink. It is best to drink small amounts of fluid more often. Drinking too much at one time can make vomiting worse. Refer to the home care instructions below. Severe dehydration needs to be treated at the hospital where you will probably be given intravenous (IV) fluids that contain water and electrolytes. HOME CARE INSTRUCTIONS   Ask your caregiver about specific rehydration instructions.  Drink enough fluids to keep your urine clear or pale yellow.  Drink small amounts frequently if you have nausea and vomiting.  Eat as you normally do.  Avoid:  Foods or drinks high in sugar.  Carbonated  drinks.  Juice.  Extremely hot or cold fluids.  Drinks with caffeine.  Fatty, greasy foods.  Alcohol.  Tobacco.  Overeating.  Gelatin desserts.  Wash your hands well to avoid spreading bacteria and viruses.  Only take over-the-counter or prescription medicines for pain, discomfort, or fever as directed by your caregiver.  Ask your caregiver if you should continue all prescribed and over-the-counter medicines.  Keep all follow-up appointments with your caregiver. SEEK MEDICAL CARE IF:  You have abdominal pain and it increases or stays in one area (localizes).  You have a rash, stiff neck, or severe headache.  You are irritable, sleepy, or difficult to awaken.  You are weak, dizzy, or extremely thirsty. SEEK IMMEDIATE MEDICAL CARE IF:   You are unable to keep fluids down or you get worse despite treatment.  You have frequent episodes of vomiting or diarrhea.  You have blood or green matter (bile) in your vomit.  You have blood in your stool or your stool looks black and tarry.  You have not urinated in 6 to 8 hours, or you have only urinated a small amount of very dark urine.  You have a fever.  You faint. MAKE SURE YOU:   Understand these instructions.  Will watch your condition.  Will get help right away if you are not doing well or get worse. Document Released: 04/19/2005 Document Revised: 07/12/2011 Document Reviewed: 12/07/2010 [employer] Patient Information 2015 [employer]. This information is not intended to replace advice given to you by your health care provider. Make sure you discuss any questions you have with your health care   provider. Hypercalcemia Hypercalcemia means the calcium in your blood is too high. Calcium in our blood is important for the control of many things, such as:  Blood clotting.  Conducting of nerve impulses.  Muscle contraction.  Maintaining teeth and bone health.  Other body functions. In the bloodstream, calcium  maintains a constant balance with another mineral, phosphate. Calcium is absorbed into the body through the small intestine. This is helped by Vitamin D. Calcium levels are maintained mostly by vitamin D and a hormone (parathyroid hormone). But the kidneys also help. Hypercalcemia can happen when the concentration of calcium is too high for the kidneys to maintain balance. The body maintains a balance between the calcium we eat and the calcium already in our body. If calcium intake is increased or we cannot use calcium properly, there may be problems. Some common sources of calcium are:   Dairy products.  Nuts.  Eggs.  Whole grains.  Legumes.  Green leafy vegetables. CAUSES There are many causes of this condition, but some common ones are:  Hyperparathyroidism. This is an over activity of the parathyroid gland.  Cancers of the breast, kidney, lung, head and neck are common causes of calcium increases.  Medications that cause you to urinate more often (diuretics), nausea, vomiting and diarrhea also increase the calcium in the blood.  Overuse of calcium-containing antacids. SYMPTOMS  Many patients with mild hypercalcemia have no symptoms. For those with symptoms common problems include:  Loss of appetite.  Constipation.  Increased thirst.  Heart rhythm changes.  Abnormal thinking.  Nausea.  Abdominal pain.  Kidney stones.  Mood swings.  Coma and death when severe.  Vomiting.  Increased urination.  High blood pressure.  Confusion. DIAGNOSIS   Your caregiver will do a medical history and perform a physical exam on you.  Calcium and parathyroid hormone (PTH) may be measured with a blood test. TREATMENT   The treatment depends on the calcium level and what is causing the higher level. Hypercalcemia can be lifethreatening. Fast lowering of the calcium level may be necessary.  With normal kidney function, fluids can be given by vein to clear the excess calcium.  Hemodialysis works well to reduce dangerous calcium levels if there is poor kidney function. This is a procedure in which a machine is used to filter out unwanted substances. The blood is then returned to the body.  Drugs, such as diuretics, can be given after adequate fluid intake is established. These medications help the kidneys get rid of extra calcium. Drugs that lessen (inhibit) bone loss are helpful in gaining long-term control. Phosphate pills help lower high calcium levels caused by a low supply of phosphate. Anti-inflammatory agents such as steroids are helpful with some cancers and toxic levels of vitamin D.  Treatment of the underlying cause of the hypercalcemia will also correct the imbalance. Hyperparathyroidism is usually treated by surgical removal of one or more of the parathyroid glands and any tissue, other than the glands themselves, that is producing too much hormone.  The hypercalcemia caused by cancer is difficult to treat without controlling the cancer. Symptoms can be improved with fluids and drug therapy as outlined above. PROGNOSIS   Surgery to remove the parathyroid glands is usually successful. This also depends on the amount of damage to the kidneys and whether or not it can be treated.  Mild hypercalcemia can be controlled with good fluid intake and the use of effective medications.  Hypercalcemia often develops as a late complication of cancer. The  expected outlook is poor without effective anticancer therapy. PREVENTION   If you are at risk for developing hypercalcemia, be familiar with early symptoms. Report these to your caregiver.  Good fluid intake (up to four quarts of liquid a day if possible) is helpful.  Try to control nausea and vomiting, and treat fevers to avoid dehydration.  Lowering the amount of calcium in your diet is not necessary. High blood calcium reduces absorption of calcium in the intestine.  Stay as active as possible. SEEK IMMEDIATE  MEDICAL CARE IF:   You develop chest pain, sweating, or shortness of breath.  You get confused, feel faint or pass out.  You develop severe nausea and vomiting. MAKE SURE YOU:   Understand these instructions.  Will watch your condition.  Will get help right away if you are not doing well or get worse. Document Released: 07/03/2004 Document Revised: 08/14/2012 Document Reviewed: 04/14/2010 [facility] Patient Information 2015 [place], [place]. This information is not intended to replace advice given to you by your health care provider. Make sure you discuss any questions you have with your health care provider.

## 2013-10-19 NOTE — Progress Notes (Signed)
[facility] OFFICE PROGRESS NOTE  Jilda Panda, MD [address] [place] [address]  DIAGNOSIS: Essential thrombocytosis  Chief Complaint  Patient presents with  . Essential thrombocytosis    CURRENT THERAPY: Started on Hydrea 500mg  PO BID since Jan 2013.   INTERVAL HISTORY:  Sarah Henson 80 y.o. female returns for regular follow up by herself. She reported feeling well. She stopped driving for the past one month.  She has has had trouble with her hip due some nerve discomfort. Her son is driving her around.  She denied headache, visual changes, leg pain/swelling, abdominal pain, bleeding symptoms, skin rash. She reports that she aged out of her mammograms.  Her energy and appetite is good.  She has seen her primary care physician Dr. Mellody Drown without changes in her medications.  The rest of the 14-point review of system was negative.    MEDICAL HISTORY: Past Medical History  Diagnosis Date  . Hypertension   . Hyperlipidemia   . Osteopenia   . Herpes zoster   . Sinusitis   . Cervical spondylosis   . Thrombocytosis   . Essential thrombocytosis 08/27/2011    INTERIM HISTORY: has Hypertension; Hyperlipidemia; Osteopenia; Thrombocytosis; and Essential thrombocytosis on her problem list.    ALLERGIES:  has No Known Allergies.  MEDICATIONS: has a current medication list which includes the following prescription(s): amlodipine-olmesartan, aspirin, calcium-vitamin d, hydrochlorothiazide, hydroxyurea, multiple vitamins-minerals, nebivolol, and simvastatin.  SURGICAL HISTORY:  Past Surgical History  Procedure Laterality Date  . Left inguinal hernia repair    . Bilateral catarac surgery      REVIEW OF SYSTEMS:   Constitutional: Denies fevers, chills or abnormal weight loss Eyes: Denies blurriness of vision Ears, nose, mouth, throat, and face: Denies mucositis or sore throat Respiratory: Denies cough, dyspnea or wheezes Cardiovascular: Denies palpitation, chest  discomfort or lower extremity swelling Gastrointestinal:  Denies nausea, heartburn or change in bowel habits Skin: Denies abnormal skin rashes Lymphatics: Denies new lymphadenopathy or easy bruising Neurological:Denies numbness, tingling or new weaknesses Behavioral/Psych: Mood is stable, no new changes  All other systems were reviewed with the patient and are negative.  PHYSICAL EXAMINATION: ECOG PERFORMANCE STATUS: 0 - Asymptomatic  Blood pressure 105/61, pulse 74, temperature 98.1 F (36.7 C), temperature source Oral, resp. rate 18, height 5' (1.524 m), weight 144 lb 9.6 oz (65.59 kg), SpO2 98.00%.  GENERAL:alert, no distress and comfortable, pleasant elderly female who appears her stated age SKIN: skin color, texture, turgor are normal, no rashes or significant lesions EYES: normal, Conjunctiva are pink and non-injected, sclera clear OROPHARYNX:no exudate, no erythema and lips, buccal mucosa, and tongue normal  NECK: supple, thyroid normal size, non-tender, without nodularity LYMPH:  no palpable lymphadenopathy in the cervical, axillary or supraclavicular LUNGS: clear to auscultation and percussion with normal breathing effort HEART: regular rate & rhythm and no murmurs and no lower extremity edema ABDOMEN:abdomen soft, non-tender and normal bowel sounds Musculoskeletal:no cyanosis of digits and no clubbing  NEURO: alert & oriented x 3 with fluent speech, no focal motor/sensory deficits; gait normal.    LABORATORY DATA: Results for orders placed in visit on 10/19/13 (from the past 48 hour(s))  CBC WITH DIFFERENTIAL     Status: Abnormal   Collection Time    10/19/13 12:28 PM      Result Value Ref Range   WBC 4.6  3.9 - 10.3 10e3/uL   NEUT# 2.1  1.5 - 6.5 10e3/uL   HGB 11.1 (*) 11.6 - 15.9 g/dL  HCT 32.9 (*) 34.8 - 46.6 %   Platelets 205  145 - 400 10e3/uL   MCV 124.2 (*) 79.5 - 101.0 fL   MCH 41.9 (*) 25.1 - 34.0 pg   MCHC 33.8  31.5 - 36.0 g/dL   RBC 2.65 (*) 3.70 - 5.45  10e6/uL   RDW 14.0  11.2 - 14.5 %   lymph# 2.0  0.9 - 3.3 10e3/uL   MONO# 0.4  0.1 - 0.9 10e3/uL   Eosinophils Absolute 0.1  0.0 - 0.5 10e3/uL   Basophils Absolute 0.0  0.0 - 0.1 10e3/uL   NEUT% 46.2  38.4 - 76.8 %   LYMPH% 42.6  14.0 - 49.7 %   MONO% 8.3  0.0 - 14.0 %   EOS% 2.1  0.0 - 7.0 %   BASO% 0.8  0.0 - 2.0 %  COMPREHENSIVE METABOLIC PANEL (MV67)     Status: Abnormal   Collection Time    10/19/13 12:28 PM      Result Value Ref Range   Sodium 141  136 - 145 mEq/L   Potassium 3.8  3.5 - 5.1 mEq/L   Chloride 103  98 - 109 mEq/L   CO2 28  22 - 29 mEq/L   Glucose 105  70 - 140 mg/dl   BUN 32.7 (*) 7.0 - 26.0 mg/dL   Creatinine 1.4 (*) 0.6 - 1.1 mg/dL   Total Bilirubin 0.75  0.20 - 1.20 mg/dL   Alkaline Phosphatase 45  40 - 150 U/L   AST 26  5 - 34 U/L   ALT 15  0 - 55 U/L   Total Protein 7.5  6.4 - 8.3 g/dL   Albumin 4.1  3.5 - 5.0 g/dL   Calcium 10.8 (*) 8.4 - 10.4 mg/dL   Anion Gap 10  3 - 11 mEq/L       Labs:  Lab Results  Component Value Date   WBC 4.6 10/19/2013   HGB 11.1* 10/19/2013   HCT 32.9* 10/19/2013   MCV 124.2* 10/19/2013   PLT 205 10/19/2013   NEUTROABS 2.1 10/19/2013      Chemistry      Component Value Date/Time   NA 141 10/19/2013 1228   NA 140 10/27/2011 1329   K 3.8 10/19/2013 1228   K 4.2 10/27/2011 1329   CL 105 10/23/2012 1054   CL 102 10/27/2011 1329   CO2 28 10/19/2013 1228   CO2 27 10/27/2011 1329   BUN 32.7* 10/19/2013 1228   BUN 21 10/27/2011 1329   CREATININE 1.4* 10/19/2013 1228   CREATININE 1.05 10/27/2011 1329      Component Value Date/Time   CALCIUM 10.8* 10/19/2013 1228   CALCIUM 9.7 10/27/2011 1329   ALKPHOS 45 10/19/2013 1228   ALKPHOS 48 10/27/2011 1329   AST 26 10/19/2013 1228   AST 28 10/27/2011 1329   ALT 15 10/19/2013 1228   ALT 19 10/27/2011 1329   BILITOT 0.75 10/19/2013 1228   BILITOT 1.1 10/27/2011 1329     CBC:  Recent Labs Lab 10/19/13 1228  WBC 4.6  NEUTROABS 2.1  HGB 11.1*  HCT 32.9*  MCV 124.2*  PLT 205     RADIOGRAPHIC STUDIES: No results found.  ASSESSMENT: Sarah Henson 80 y.o. female with a history of Essential thrombocytosis   PLAN:   1. Hypertension: She is on nebivolol, amlodipine/olmesartan, and HCTZ per PCP.   2. Hyperlipidemia: She is on Zocor per PCP.   3. Elevated creatinine, mildly:  Creatinine is 1.4.  Counseled to increase hydration. She will follow with her pcp office in July.   4. Hypercalcemia.  Counseled to stop oral calcium (she takes one in am and one in pm).  Continued hydration.   5. JAK-2 mutation negative Essential Thrombocytosis:  -normalization of platelet count with Hydrea without side effects. She again asked if Hydrea needs to be lifelong. I recommended again lifelong Hydrea and Aspirin 81mg  PO daily to decrease risk of thrombosis including stroke and heart attack.  - She agreed with this recommendation for now.   6. Follow up: Lab here at the [facility] in about 3 months. She has return visit in about 6 months.   All questions were answered. The patient knows to call the clinic with any problems, questions or concerns. We can certainly see the patient much sooner if necessary.  I spent 15 minutes counseling the patient face to face. The total time spent in the appointment was 25 minutes.    Fredrica Capano, MD 10/19/2013 1:20 PM

## 2015-10-02 DEATH — deceased
